# Patient Record
Sex: Male | Born: 1990 | Race: Black or African American | Hispanic: No | Marital: Single | Smoking: Never smoker
Health system: Southern US, Community
[De-identification: ages and names within clinical notes are randomized; demographics above are authoritative.]

## PROBLEM LIST (undated history)

## (undated) DIAGNOSIS — F209 Schizophrenia, unspecified: Secondary | ICD-10-CM

## (undated) DIAGNOSIS — F319 Bipolar disorder, unspecified: Secondary | ICD-10-CM

---
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
## 2009-09-16 IMAGING — CT CT HEAD WITHOUT CONTRAST
2 series · 16 of 30 positions shown, 20 images · non-contrast
Comparison: none

REASON FOR EXAM: ?first time schizophrenic break, acting differently
COMMENTS:

[Series 2: without · axial · non-contrast · 0.43mm/px · z∈[-135,+0]mm · 13 of 33 slices shown, 17 images]
[im 3/33  brain]
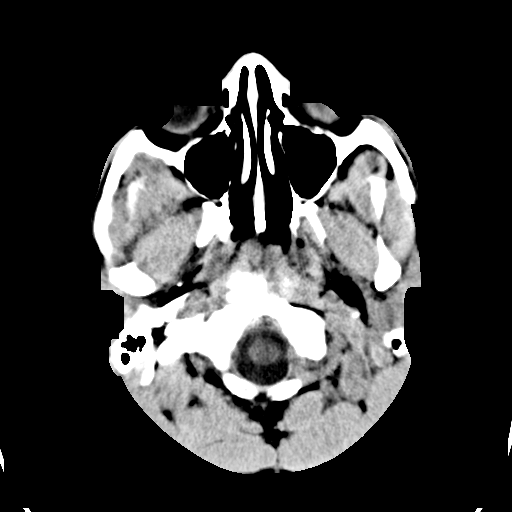
[im 3/33  bone]
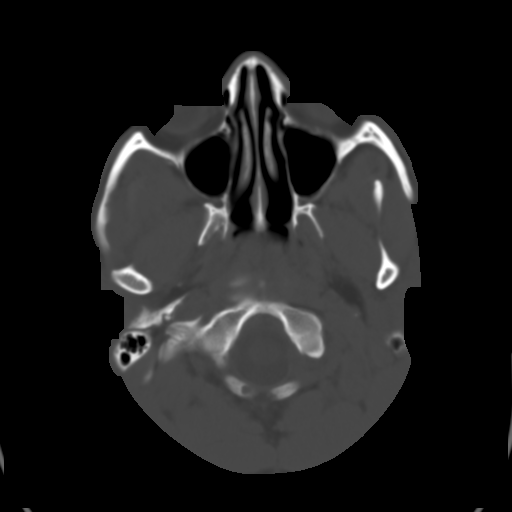
[im 5/33  brain]
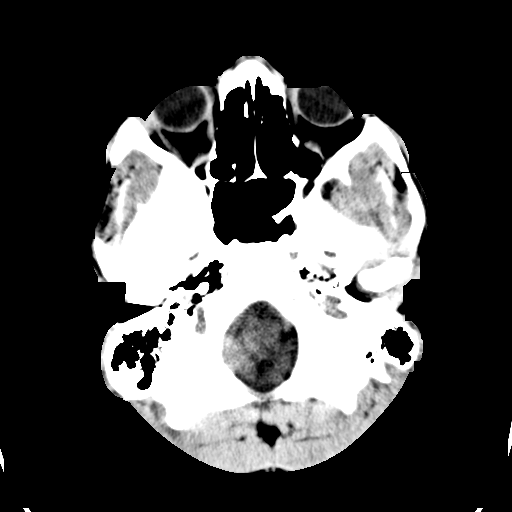
[im 7/33  brain]
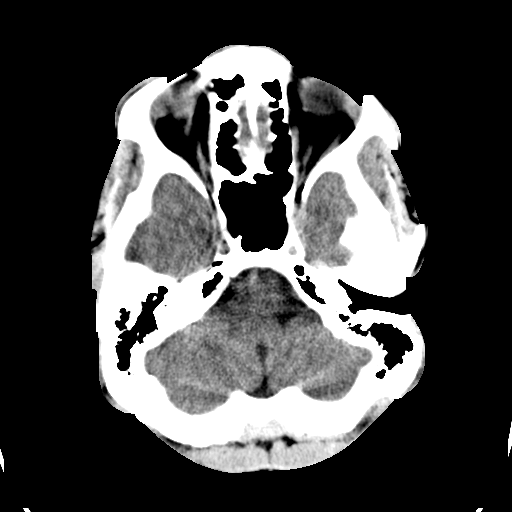
[im 10/33  brain]
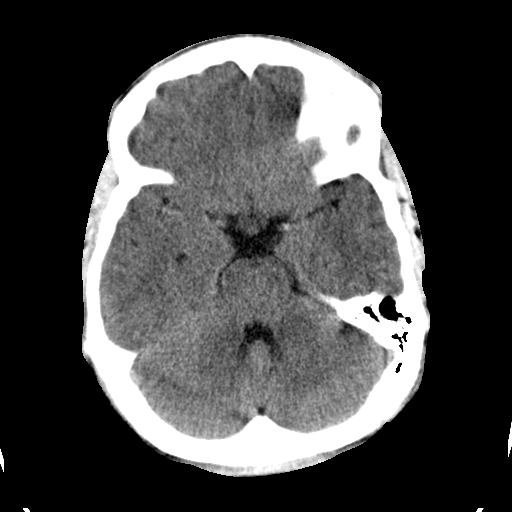
[im 12/33  brain]
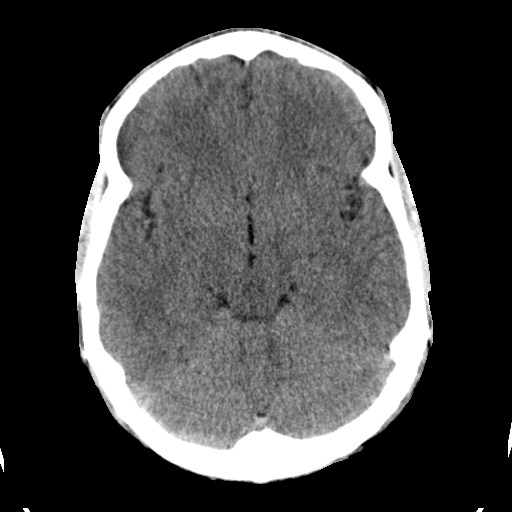
[im 12/33  bone]
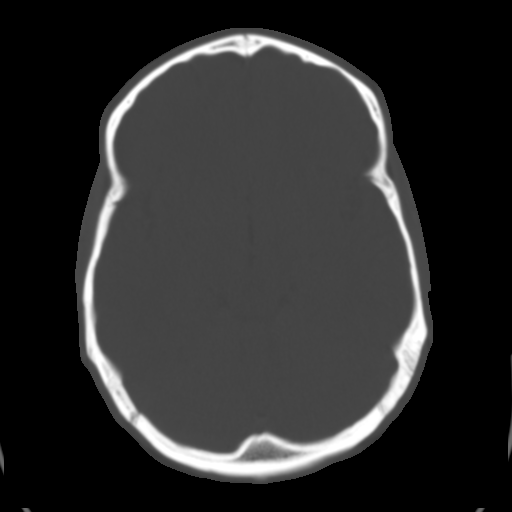
[im 14/33  brain]
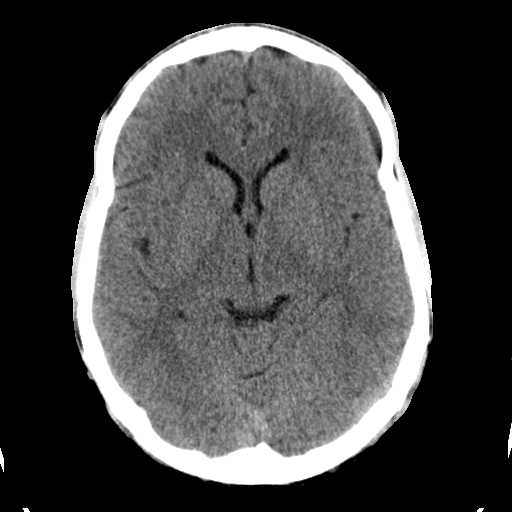
[im 17/33  brain]
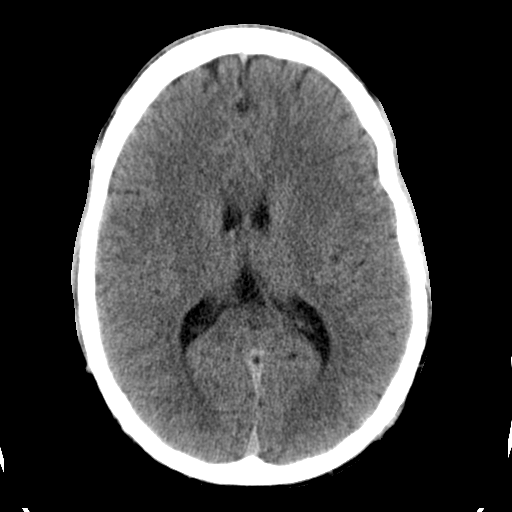
[im 19/33  brain]
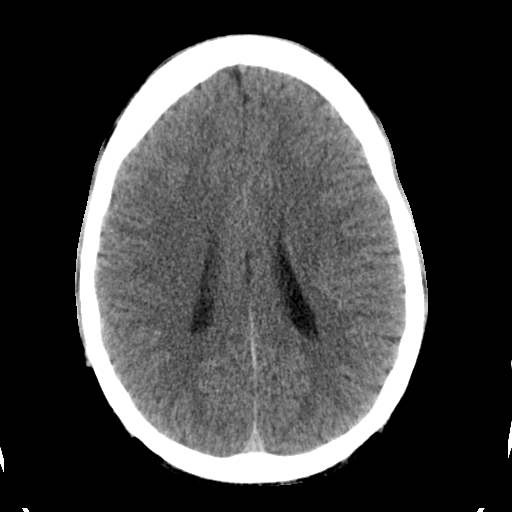
[im 21/33  brain]
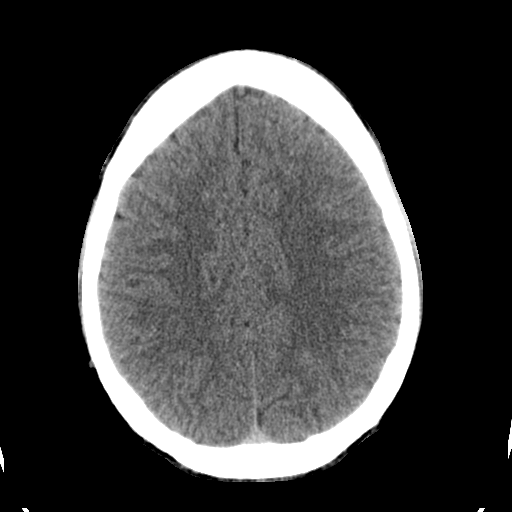
[im 21/33  bone]
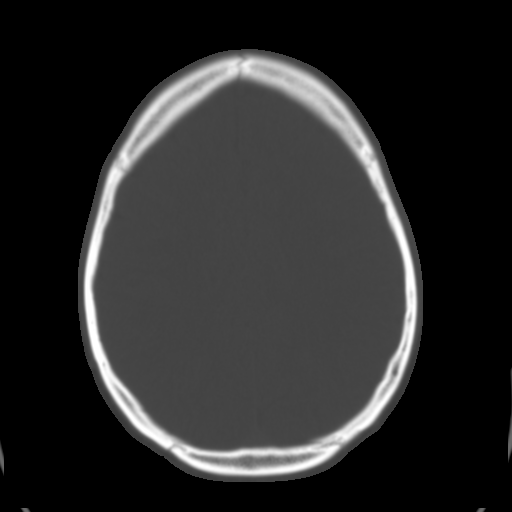
[im 23/33  brain]
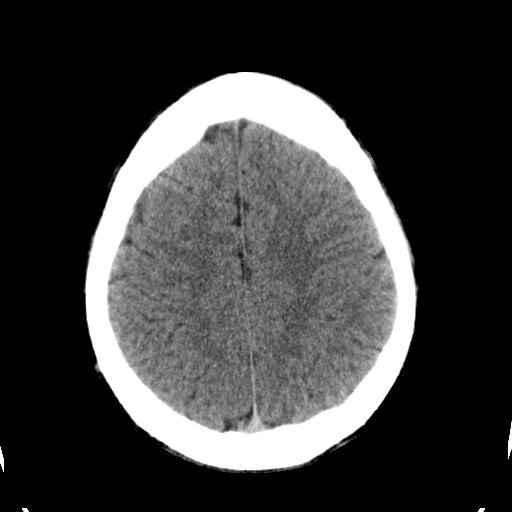
[im 26/33  brain]
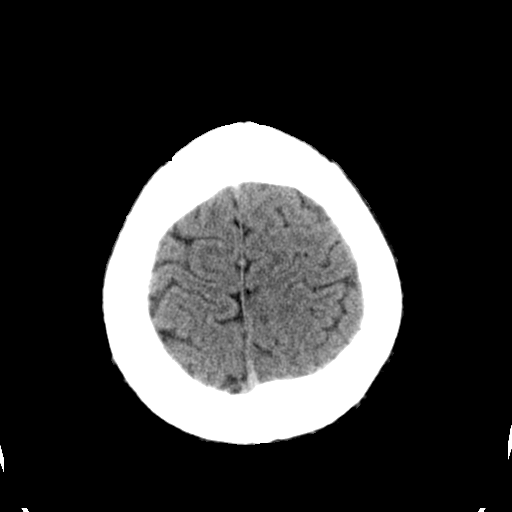
[im 28/33  brain]
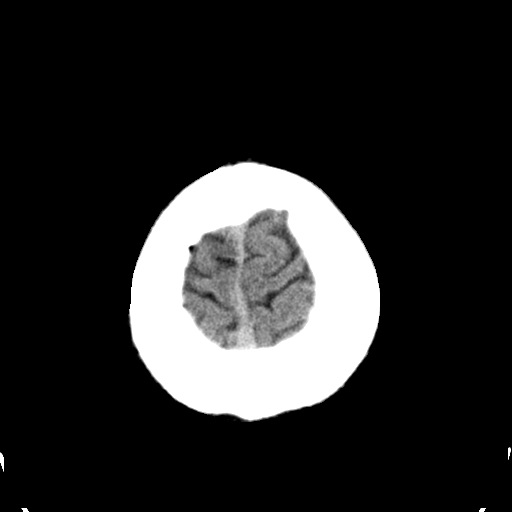
[im 30/33  brain]
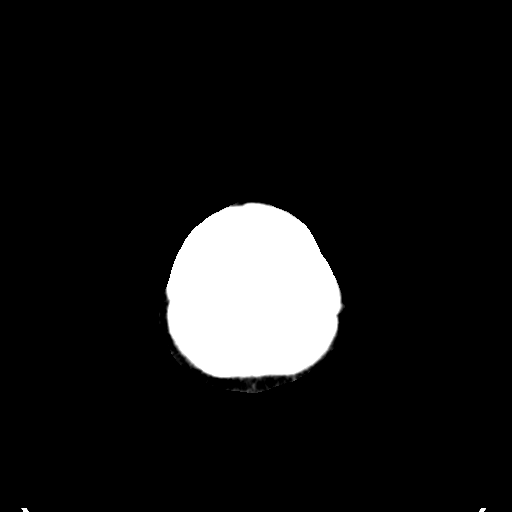
[im 30/33  bone]
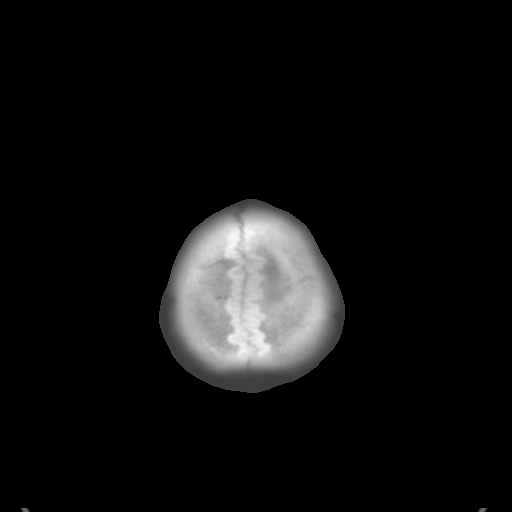

[Series 3: bone · axial · 0.43mm/px · z∈[-135,-90]mm · 3 of 32 slices shown]
[im 3/32  bone]
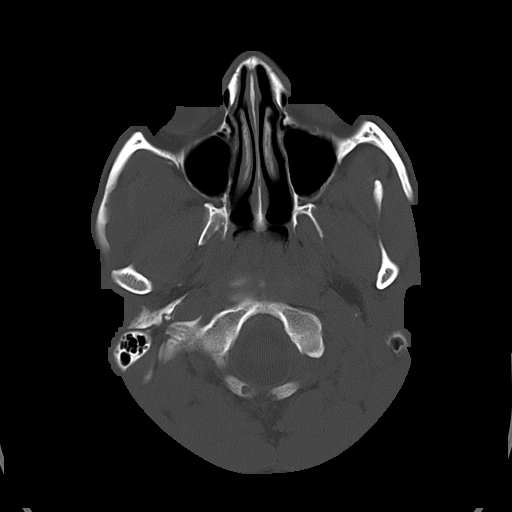
[im 7/32  bone]
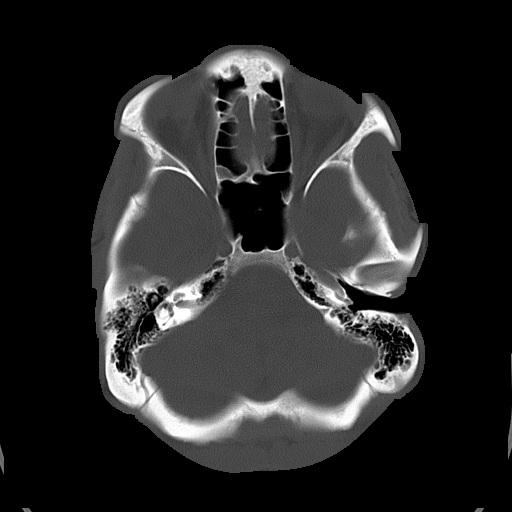
[im 12/32  bone]
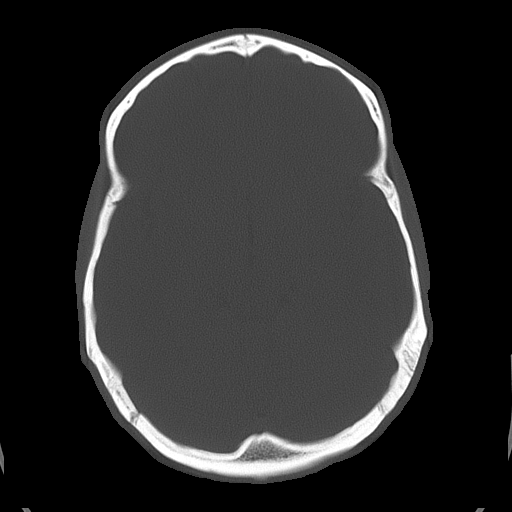

[16 of 30 positions shown; findings below may reference images not displayed]

PROCEDURE:     CT  - CT HEAD WITHOUT CONTRAST  - September 09, 2008  [DATE]

RESULT:     The patient is exhibiting mental status change. A noncontrast CT
scan was performed with images obtained at 5 millimeters intervals and slice
thicknesses.

The ventricles are normal in size and position. There is no intracranial
hemorrhage, mass, or mass-effect. The cerebellum and brainstem are normal in
density. At bone window settings the observed portions of the paranasal
sinuses exhibit no air-fluid levels. There is no lytic or blastic bony
lesion. The mastoid air cells are well pneumatized.
IMPRESSION: I see no acute intracranial abnormality.

This report was called to the [HOSPITAL] the conclusion of the
study.

## 2010-01-12 ENCOUNTER — Emergency Department: Payer: Self-pay | Admitting: Emergency Medicine

## 2010-09-04 ENCOUNTER — Emergency Department: Payer: Self-pay | Admitting: Emergency Medicine

## 2011-06-06 ENCOUNTER — Emergency Department: Payer: Self-pay | Admitting: Unknown Physician Specialty

## 2012-06-12 IMAGING — CR DG CHEST 2V
1 series · 2 of 2 positions shown · non-contrast
Comparison: none

REASON FOR EXAM: cough , fever
COMMENTS:

[Series 1: view not recorded · 0.17mm/px · 2 of 2 slices shown]
[im 1/2]
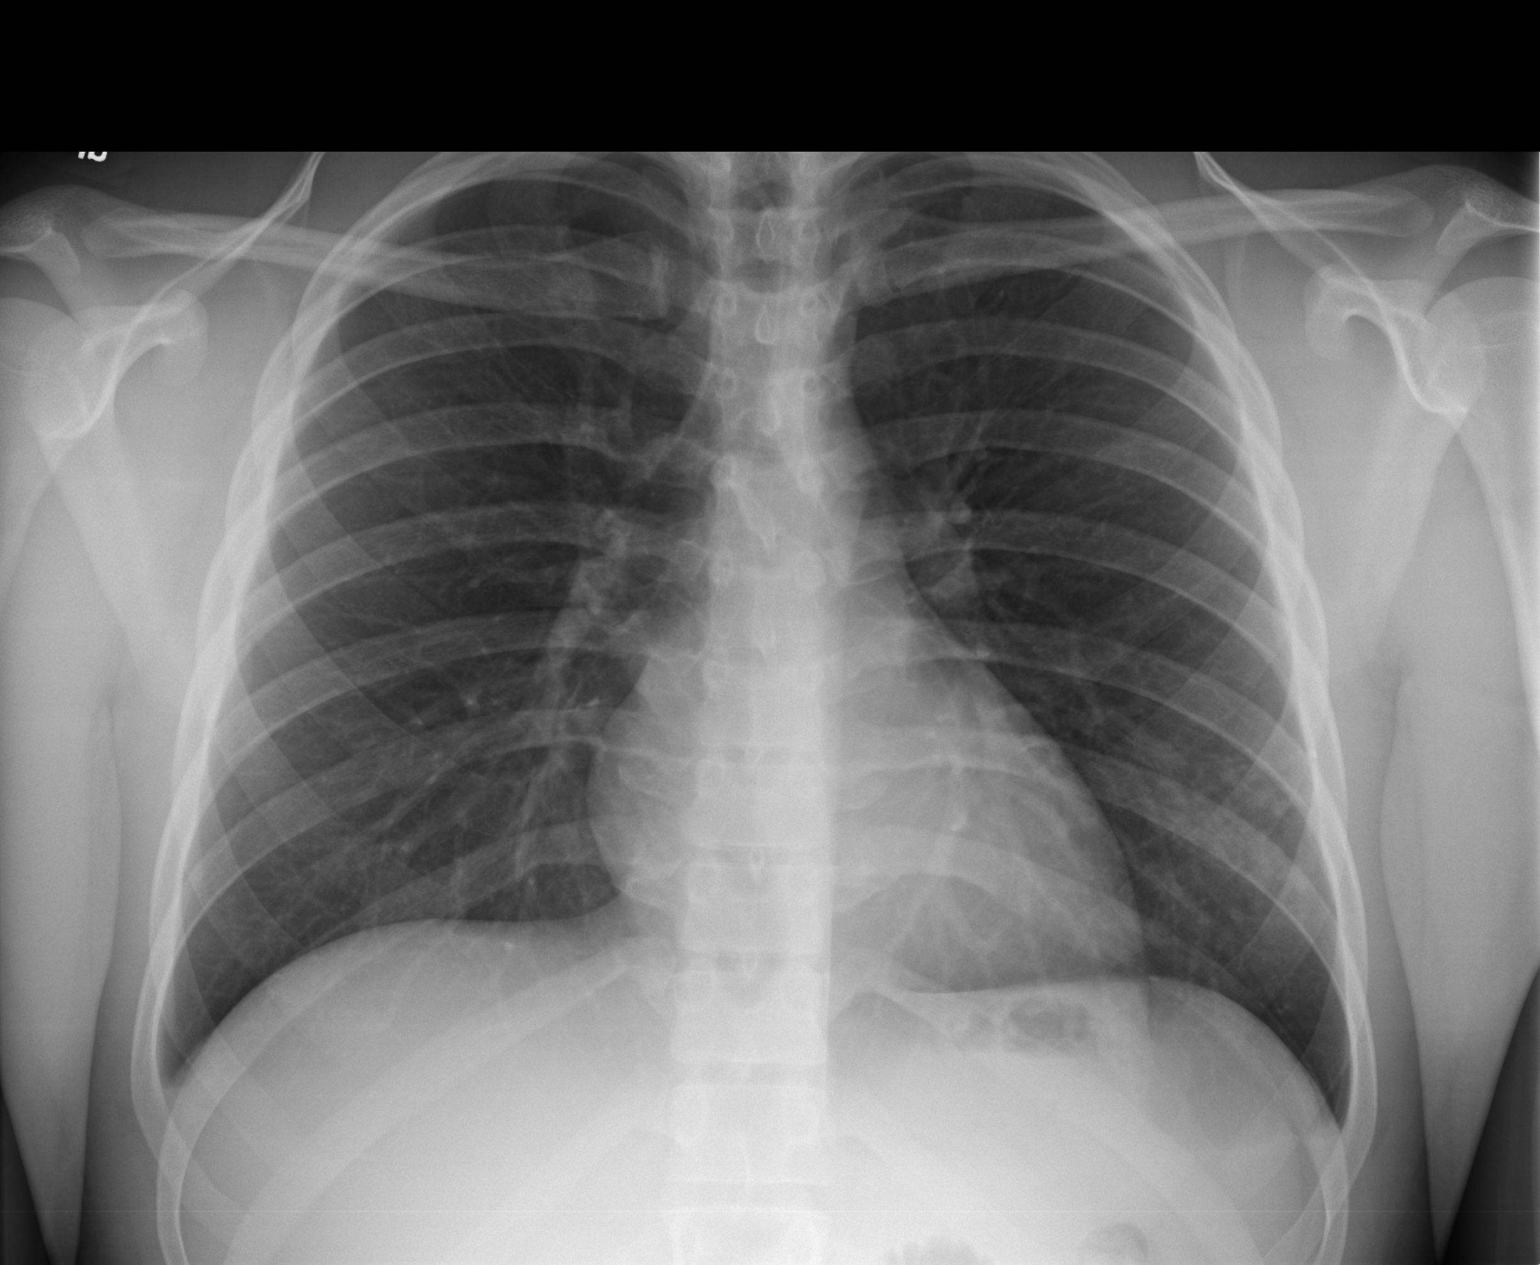
[im 2/2]
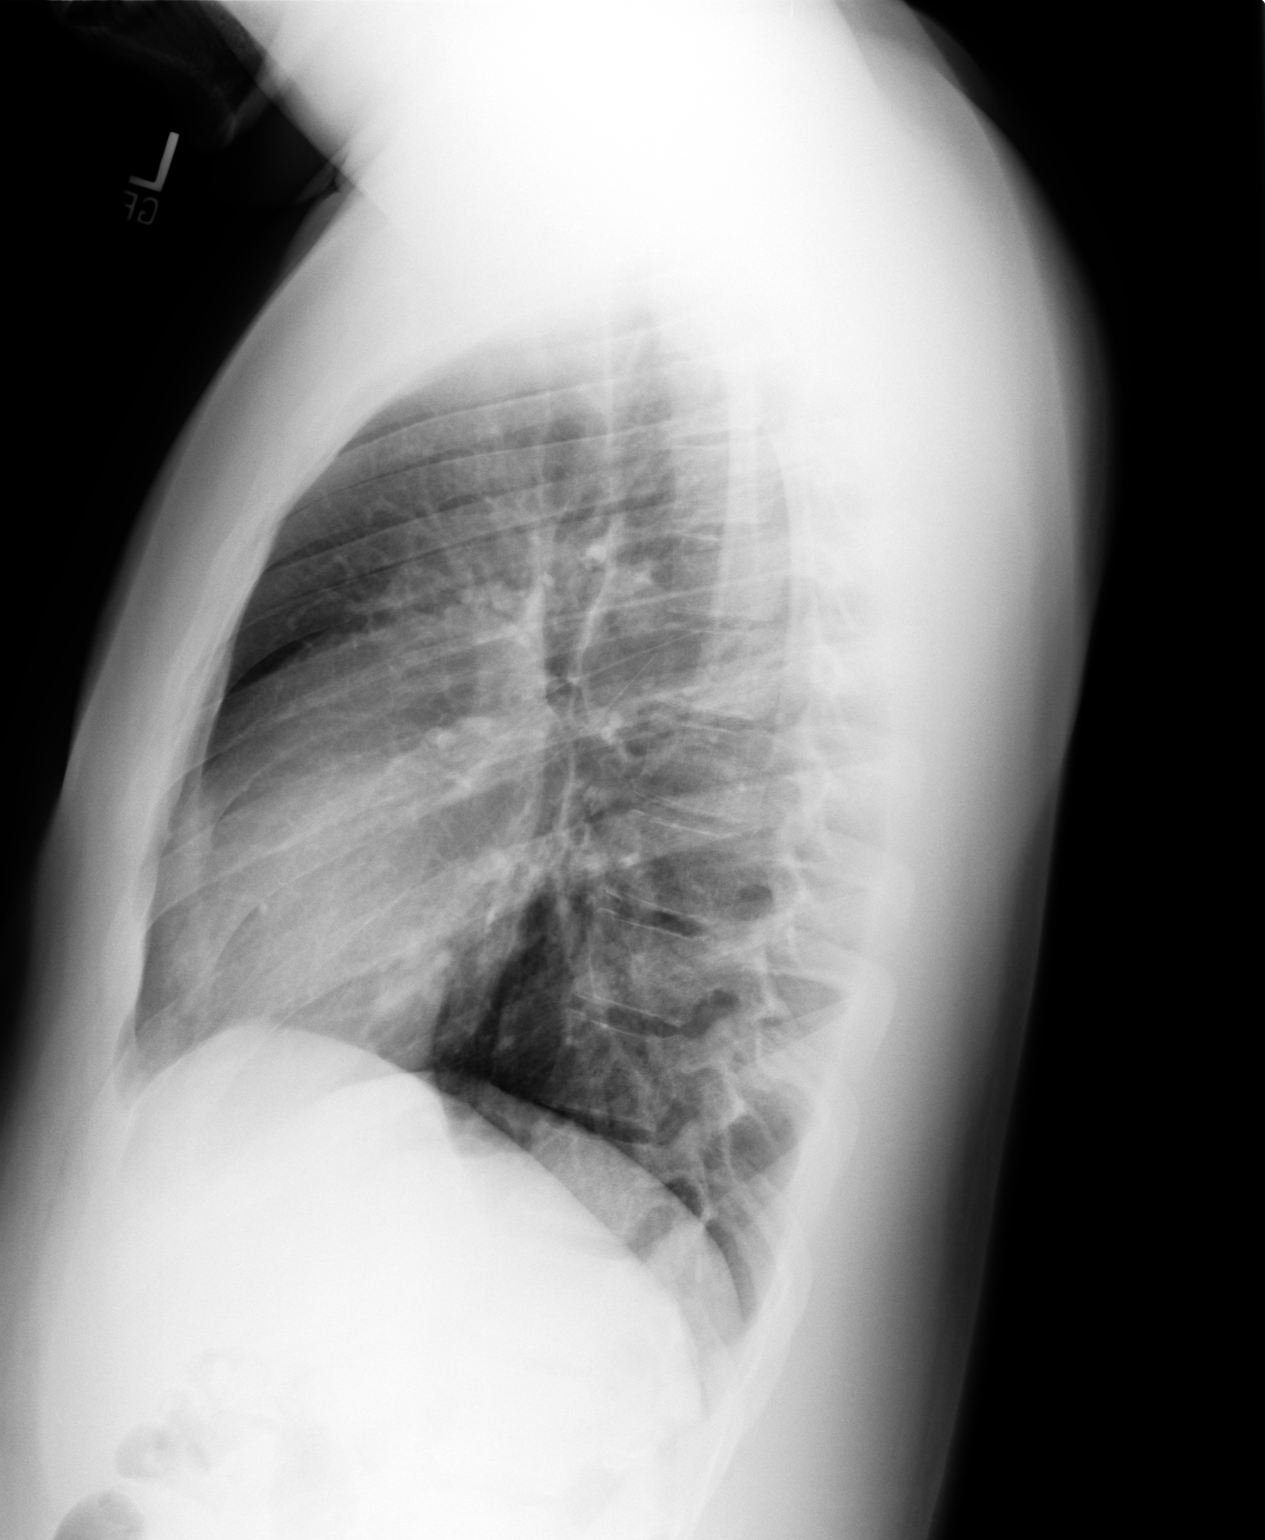

[2 of 2 positions shown; findings below may reference images not displayed]

PROCEDURE:     DXR - DXR CHEST PA (OR AP) AND LATERAL  - June 06, 2011  [DATE]

RESULT:     There is a minimal patchy increase in density in the left lower
lung field as visualized in the PA view. The finding is not definitely
identified in the lateral view. The appearance is compatible with a minimal
patchy pneumonia or with atelectasis. The right lung field is clear. Heart
size is normal. Mediastinal and osseous structures are normal in appearance.
IMPRESSION: There is a minimal patchy increase in density in the left lower lung field,
suspicious for pneumonia.

## 2012-06-12 IMAGING — CT CT ABD-PELV W/ CM
1 of 2 series · 15 of 32 positions shown, 19 images · non-contrast
Comparison: none

REASON FOR EXAM: (1) pain diffuse  iv contrast only; (2) same
COMMENTS:

[Series 2: 3mm soft tissue · axial · 0.72mm/px · z∈[-1196,-782]mm · 15 of 150 slices shown, 19 images]
[im 6/150  soft-tissue]
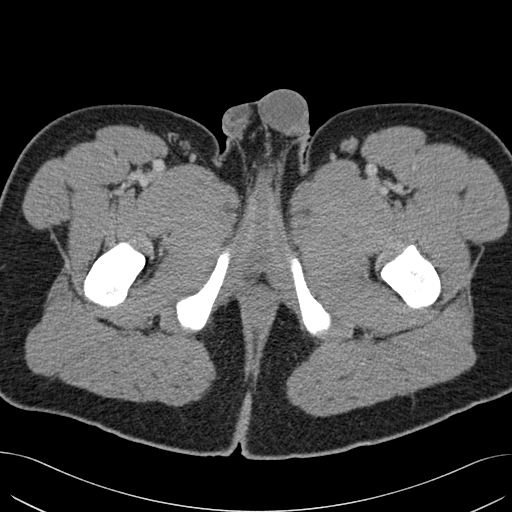
[im 6/150  bone]
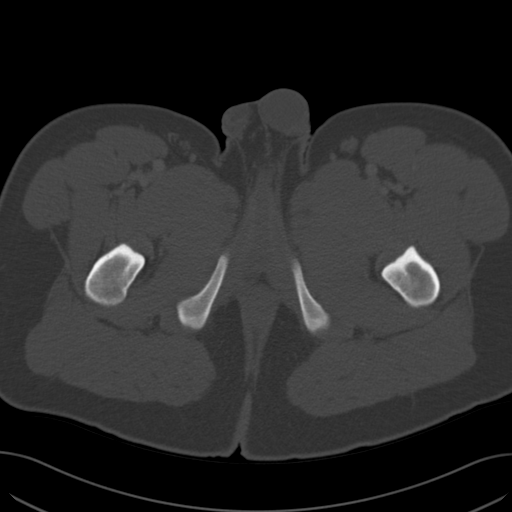
[im 18/150  soft-tissue]
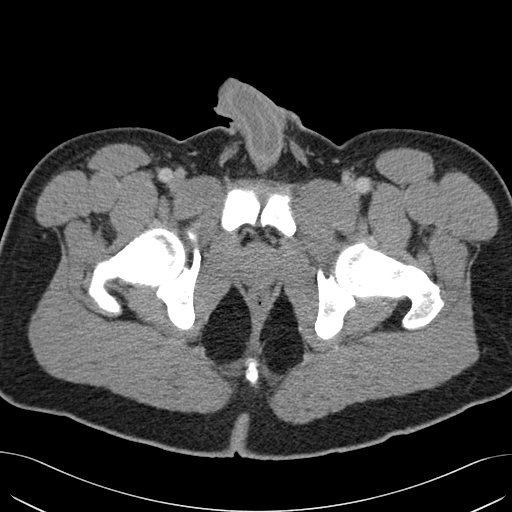
[im 30/150  soft-tissue]
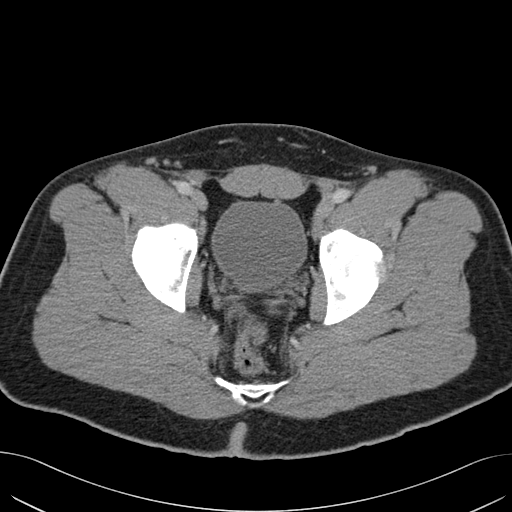
[im 42/150  soft-tissue]
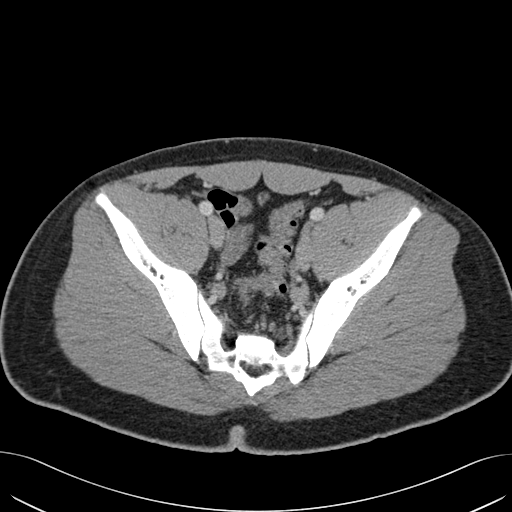
[im 54/150  soft-tissue]
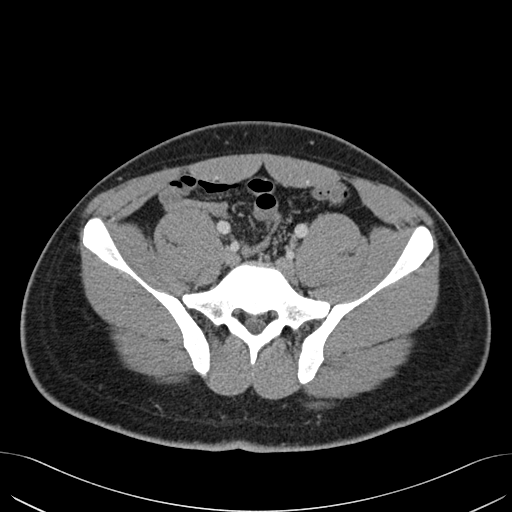
[im 66/150  soft-tissue]
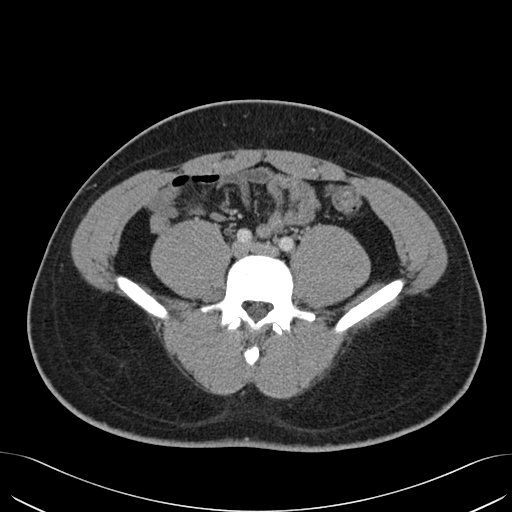
[im 78/150  soft-tissue]
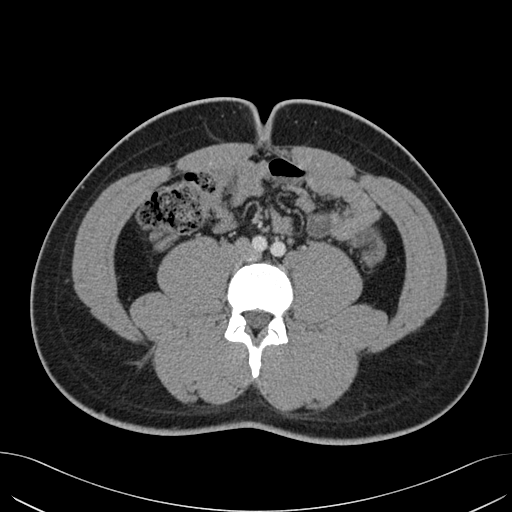
[im 84/150  soft-tissue]
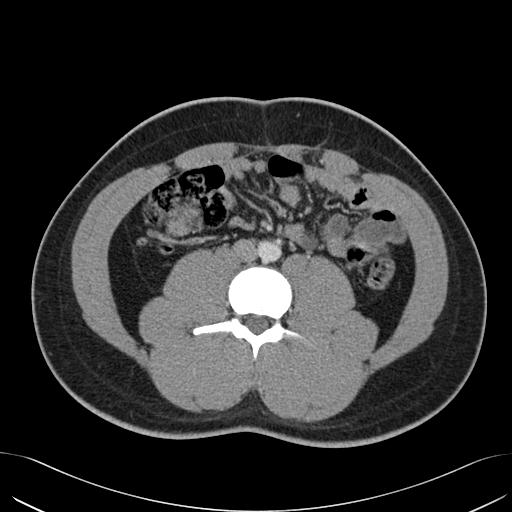
[im 96/150  soft-tissue]
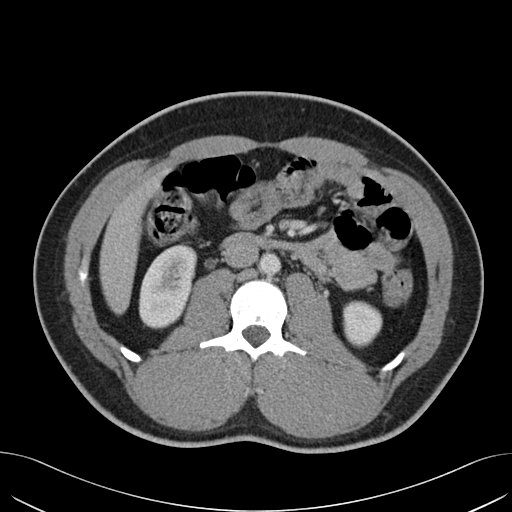
[im 96/150  bone]
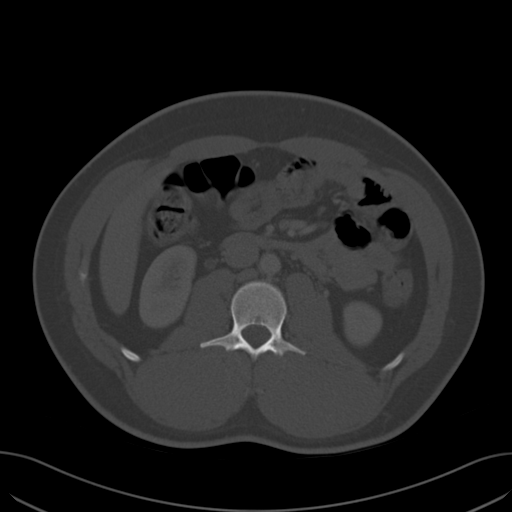
[im 108/150  soft-tissue]
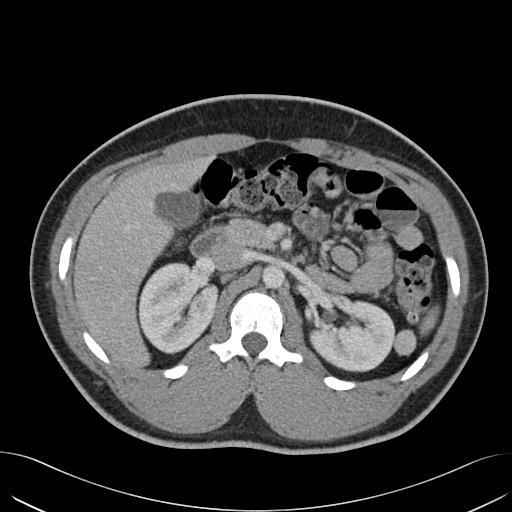
[im 120/150  soft-tissue]
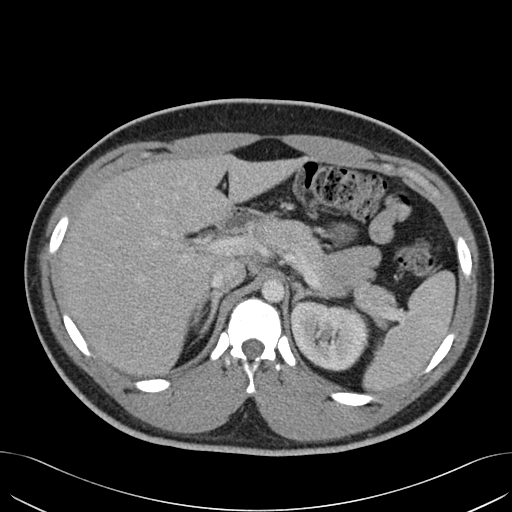
[im 126/150  lung]
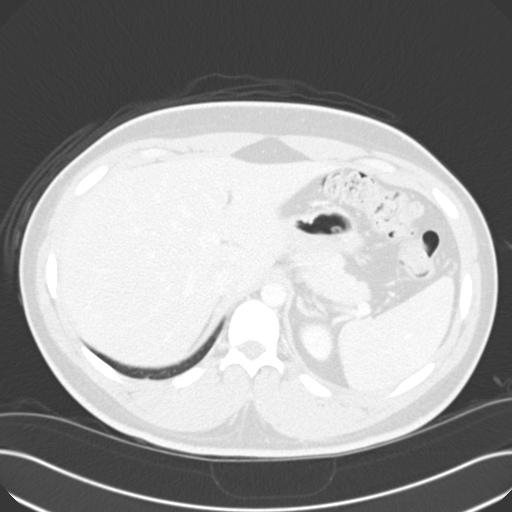
[im 132/150  soft-tissue]
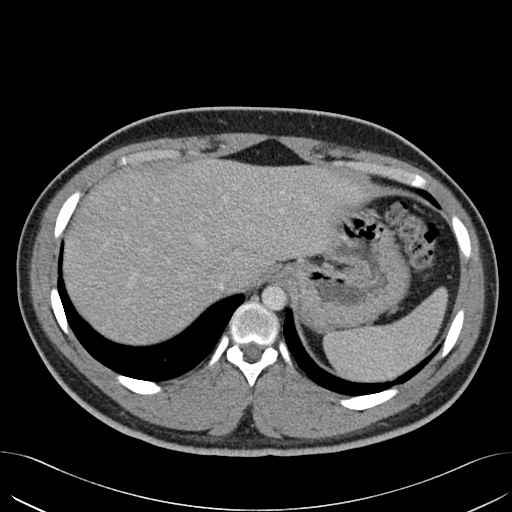
[im 132/150  lung]
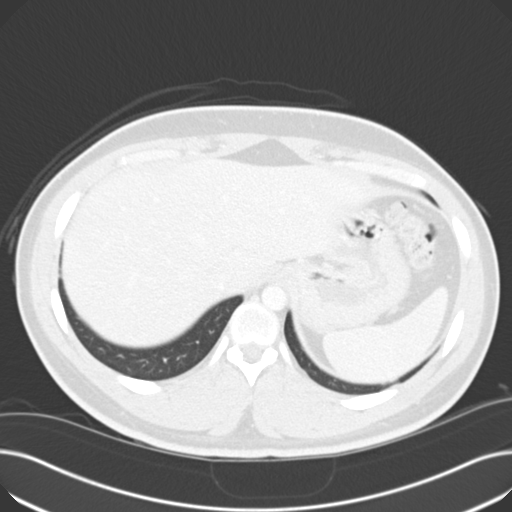
[im 138/150  lung]
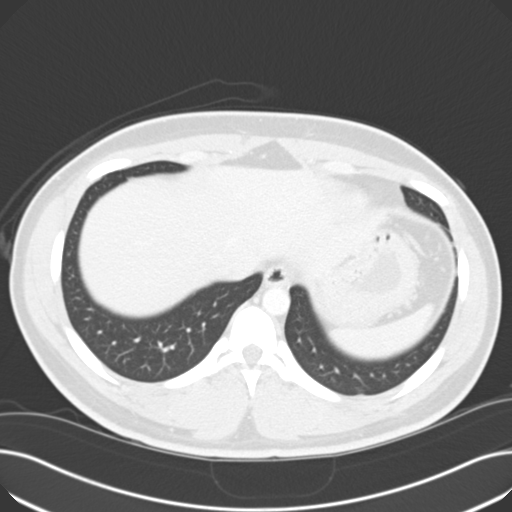
[im 144/150  soft-tissue]
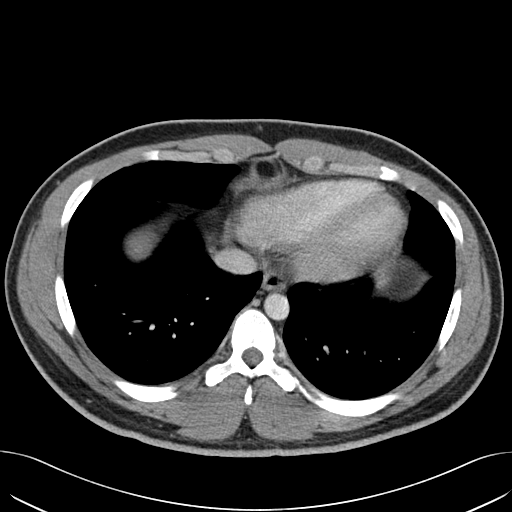
[im 144/150  lung]
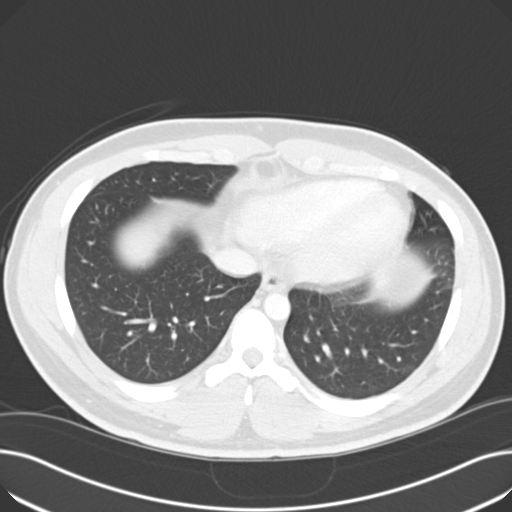

[15 of 32 positions shown; findings below may reference images not displayed]

PROCEDURE:     CT  - CT ABDOMEN / PELVIS  W  - June 06, 2011 [DATE]

RESULT:     Axial CT scanning was performed through the abdomen and pelvis
following administration of 85 cc of Psovue-TEH. The patient did not receive
oral contrast. Reconstructions were obtained at 3 mm intervals and slice
thicknesses. Review of multiplanar reconstructed images was performed
separately on the VIA monitor.

There is an abnormal appearance of the anterior aspect of the left lower
lobe demonstrated on the entry slices of the study. This could reflect
pneumonia or atelectasis in the appropriate clinical setting. The cardiac
chambers appear normal in size and I see no pleural effusion.

The liver, spleen, partially distended stomach, pancreas, gallbladder,
adrenal glands, and kidneys are normal in appearance. I see no periaortic
nor pericaval lymphadenopathy. There is an approximately 2 cm diameter
accessory spleen. The unopacified loops of small and large bowel exhibit no
evidence of ileus nor obstruction. A normal-appearing air-filled appendix is
demonstrated on images 60 through 75. Within the pelvis I see no free fluid.
The urinary bladder is partially distended and grossly normal. The prostate
gland and seminal vesicles also are grossly normal for age. I see no
inguinal hernia nor umbilical hernia. The lumbar vertebral bodies are
preserved in height. The bony pelvis exhibits no acute abnormality.
IMPRESSION: 1. I do not see evidence of bowel obstruction nor ileus or gastroenteritis.
A normal-appearing appendix is demonstrated.
2. I see no acute hepatobiliary abnormality nor acute urinary tract
abnormality.
3. I see no intra-abdominal nor pelvic lymphadenopathy.
4. There is increased density in the left lower lobe on the uppermost images
of the scan consistent with pneumonia or atelectasis. This will merit
followup.

## 2012-09-10 ENCOUNTER — Emergency Department: Payer: Self-pay | Admitting: Emergency Medicine

## 2013-03-11 ENCOUNTER — Emergency Department: Payer: Self-pay | Admitting: Emergency Medicine

## 2013-03-11 LAB — CK TOTAL AND CKMB (NOT AT ARMC)
CK, Total: 307 U/L — ABNORMAL HIGH (ref 35–232)
CK-MB: 1.6 ng/mL (ref 0.5–3.6)

## 2013-03-11 LAB — TROPONIN I: Troponin-I: <0.02 ng/mL

## 2013-03-11 LAB — COMPREHENSIVE METABOLIC PANEL
Alkaline Phosphatase: 67 U/L (ref 50–136)
Anion Gap: 8 (ref 7–16)
Bilirubin,Total: 0.5 mg/dL (ref 0.2–1.0)
Chloride: 104 mmol/L (ref 98–107)
Co2: 26 mmol/L (ref 21–32)
Creatinine: 1.23 mg/dL (ref 0.60–1.30)
EGFR (Non-African Amer.): >60
Glucose: 89 mg/dL (ref 65–99)
SGOT(AST): 35 U/L (ref 15–37)

## 2013-03-11 LAB — CBC
HCT: 41.6 % (ref 40.0–52.0)
HGB: 14.2 g/dL (ref 13.0–18.0)
MCH: 26.3 pg (ref 26.0–34.0)
MCHC: 34.3 g/dL (ref 32.0–36.0)
MCV: 77 fL — ABNORMAL LOW (ref 80–100)
RBC: 5.42 10*6/uL (ref 4.40–5.90)
RDW: 13.6 % (ref 11.5–14.5)

## 2013-06-23 ENCOUNTER — Emergency Department: Payer: Self-pay | Admitting: Emergency Medicine

## 2013-06-23 LAB — COMPREHENSIVE METABOLIC PANEL
Albumin: 4 g/dL (ref 3.4–5.0)
Alkaline Phosphatase: 63 U/L (ref 50–136)
BUN: 7 mg/dL (ref 7–18)
Bilirubin,Total: 0.7 mg/dL (ref 0.2–1.0)
Chloride: 103 mmol/L (ref 98–107)
Co2: 27 mmol/L (ref 21–32)
Creatinine: 1.34 mg/dL — ABNORMAL HIGH (ref 0.60–1.30)
Glucose: 96 mg/dL (ref 65–99)
Potassium: 3.5 mmol/L (ref 3.5–5.1)
Sodium: 139 mmol/L (ref 136–145)
Total Protein: 7.7 g/dL (ref 6.4–8.2)

## 2013-06-23 LAB — CK TOTAL AND CKMB (NOT AT ARMC): CK-MB: 1.9 ng/mL (ref 0.5–3.6)

## 2013-06-23 LAB — CBC
HCT: 44.1 % (ref 40.0–52.0)
HGB: 15.3 g/dL (ref 13.0–18.0)
MCHC: 34.8 g/dL (ref 32.0–36.0)
MCV: 78 fL — ABNORMAL LOW (ref 80–100)
RBC: 5.69 10*6/uL (ref 4.40–5.90)
WBC: 8.1 10*3/uL (ref 3.8–10.6)

## 2013-06-23 LAB — LIPASE, BLOOD: Lipase: 98 U/L (ref 73–393)

## 2013-06-23 LAB — TROPONIN I: Troponin-I: <0.02 ng/mL

## 2013-06-23 LAB — DRUG SCREEN, URINE
Benzodiazepine, Ur Scrn: NEGATIVE (ref ?–200)
Cannabinoid 50 Ng, Ur ~~LOC~~: NEGATIVE (ref ?–50)

## 2013-06-23 LAB — TSH: Thyroid Stimulating Horm: 2.63 u[IU]/mL

## 2013-11-23 ENCOUNTER — Emergency Department: Payer: Self-pay | Admitting: Emergency Medicine

## 2014-03-18 IMAGING — CR DG CHEST 2V
1 series · 2 of 2 positions shown · non-contrast
Comparison: none

REASON FOR EXAM: CP
COMMENTS:

[Series 1: pa · 0.17mm/px · 2 of 2 slices shown]
[im 1/2]
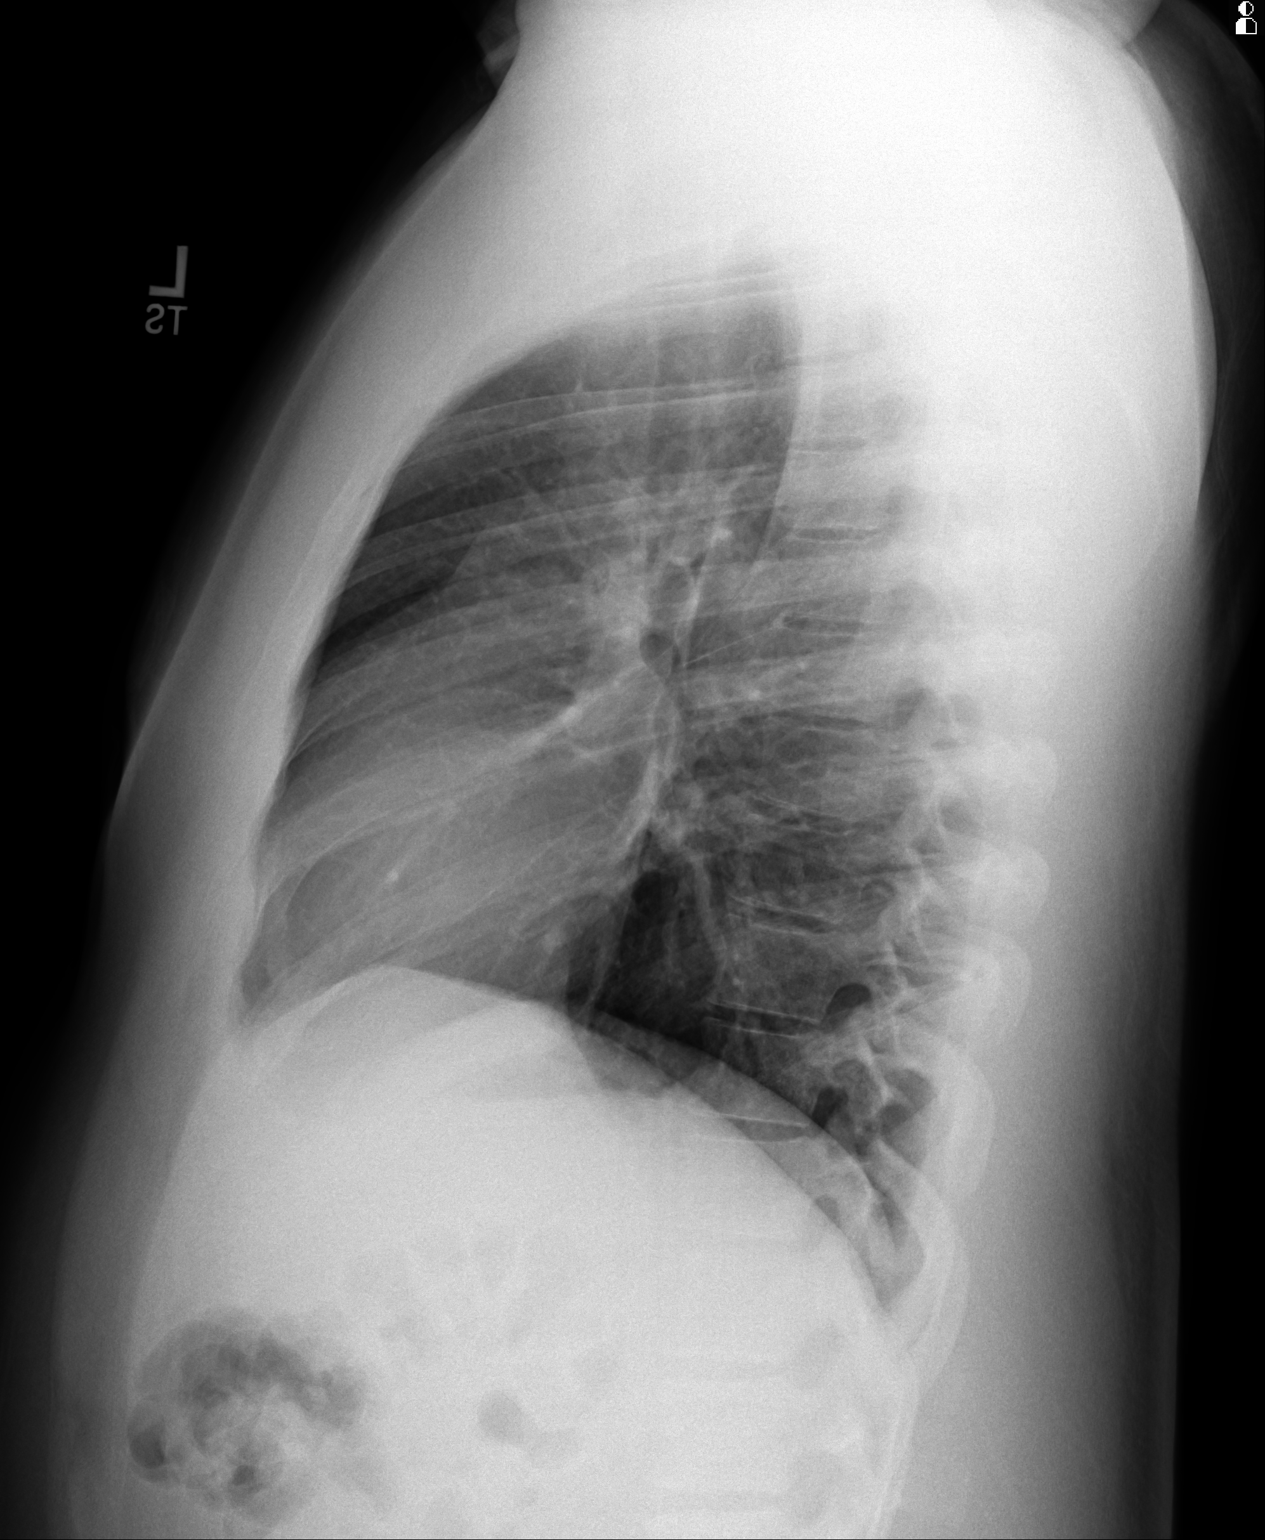
[im 2/2]
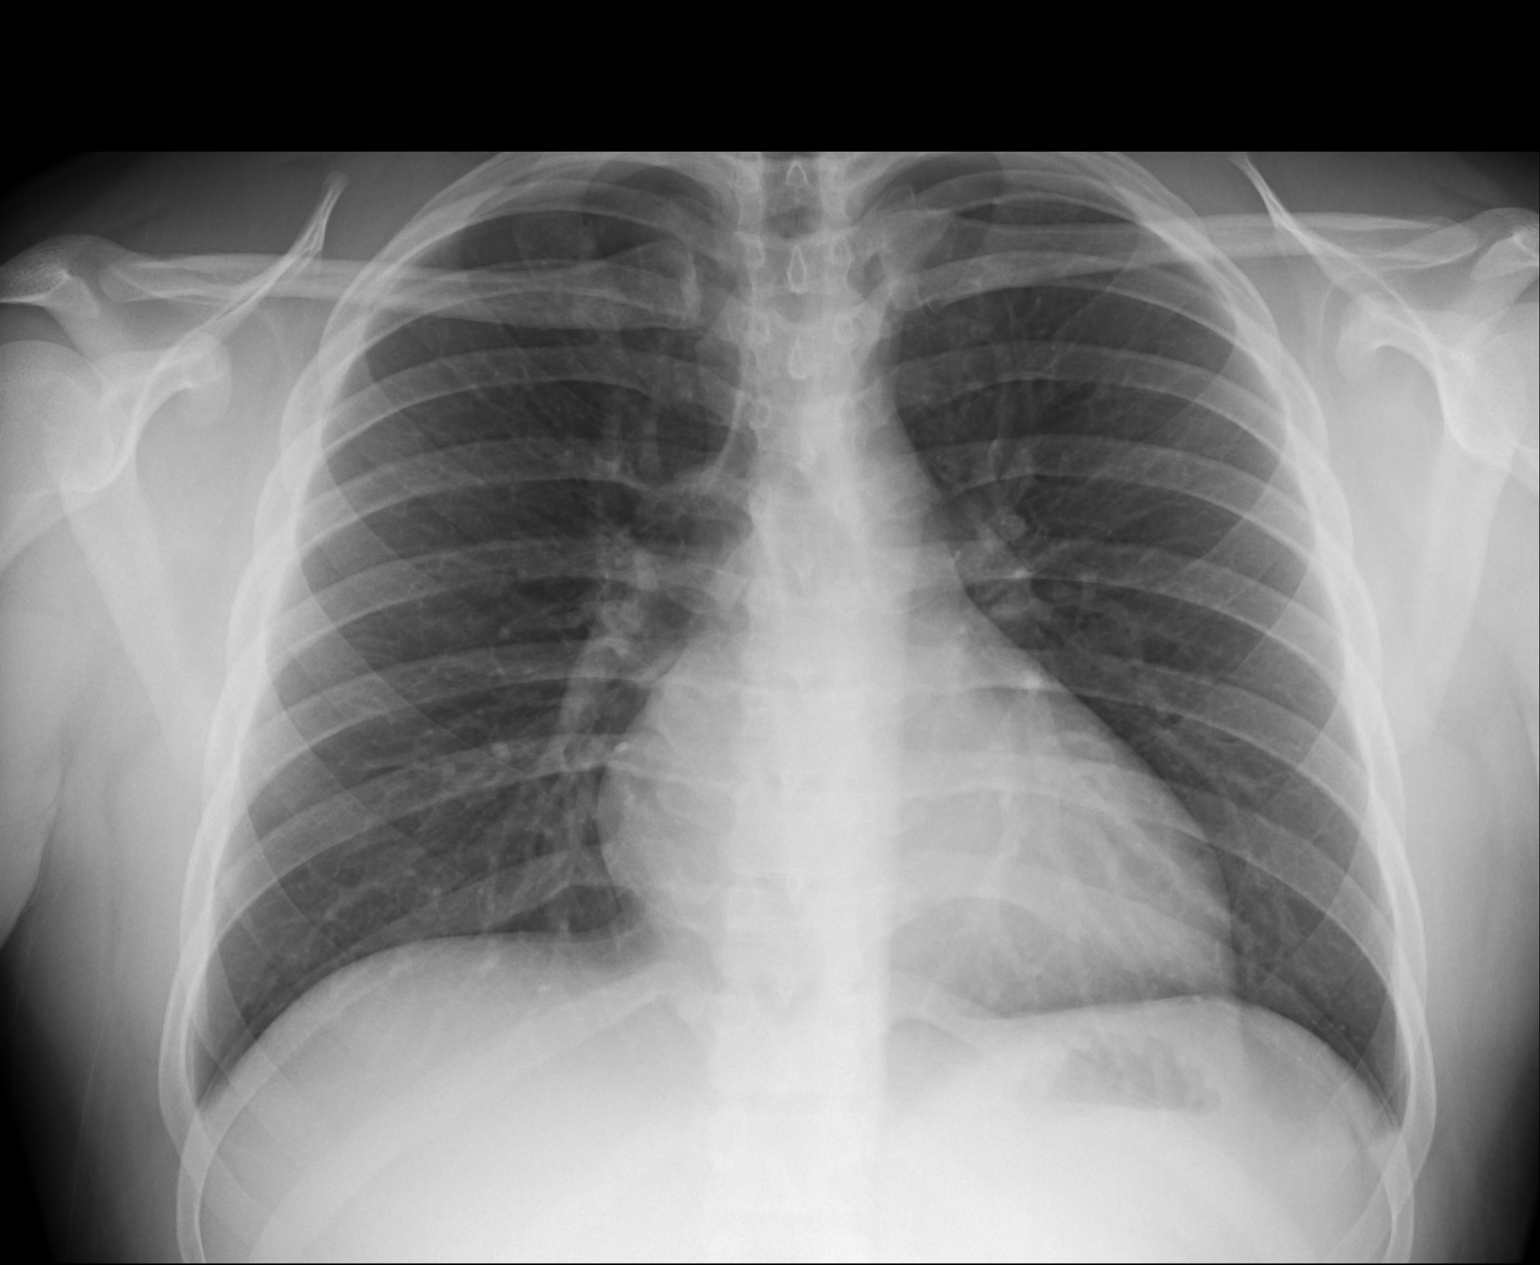

[2 of 2 positions shown; findings below may reference images not displayed]

PROCEDURE:     DXR - DXR CHEST PA (OR AP) AND LATERAL  - March 11, 2013  [DATE]

RESULT:     The lungs are adequately inflated and clear. The heart and
mediastinal structures are within the limits of normal. There is no pleural
effusion or pneumothorax. The trachea is midline. The bony thorax exhibits
no acute abnormality.
IMPRESSION: There is no evidence of acute cardiopulmonary abnormality.

[REDACTED]

## 2014-06-30 IMAGING — CR DG CHEST 2V
1 series · 2 of 2 positions shown · non-contrast
Comparison: none

REASON FOR EXAM: Chest Pain
COMMENTS:

[Series 1: w chest pa · 0.14mm/px · 2 of 2 slices shown]
[im 1/2]
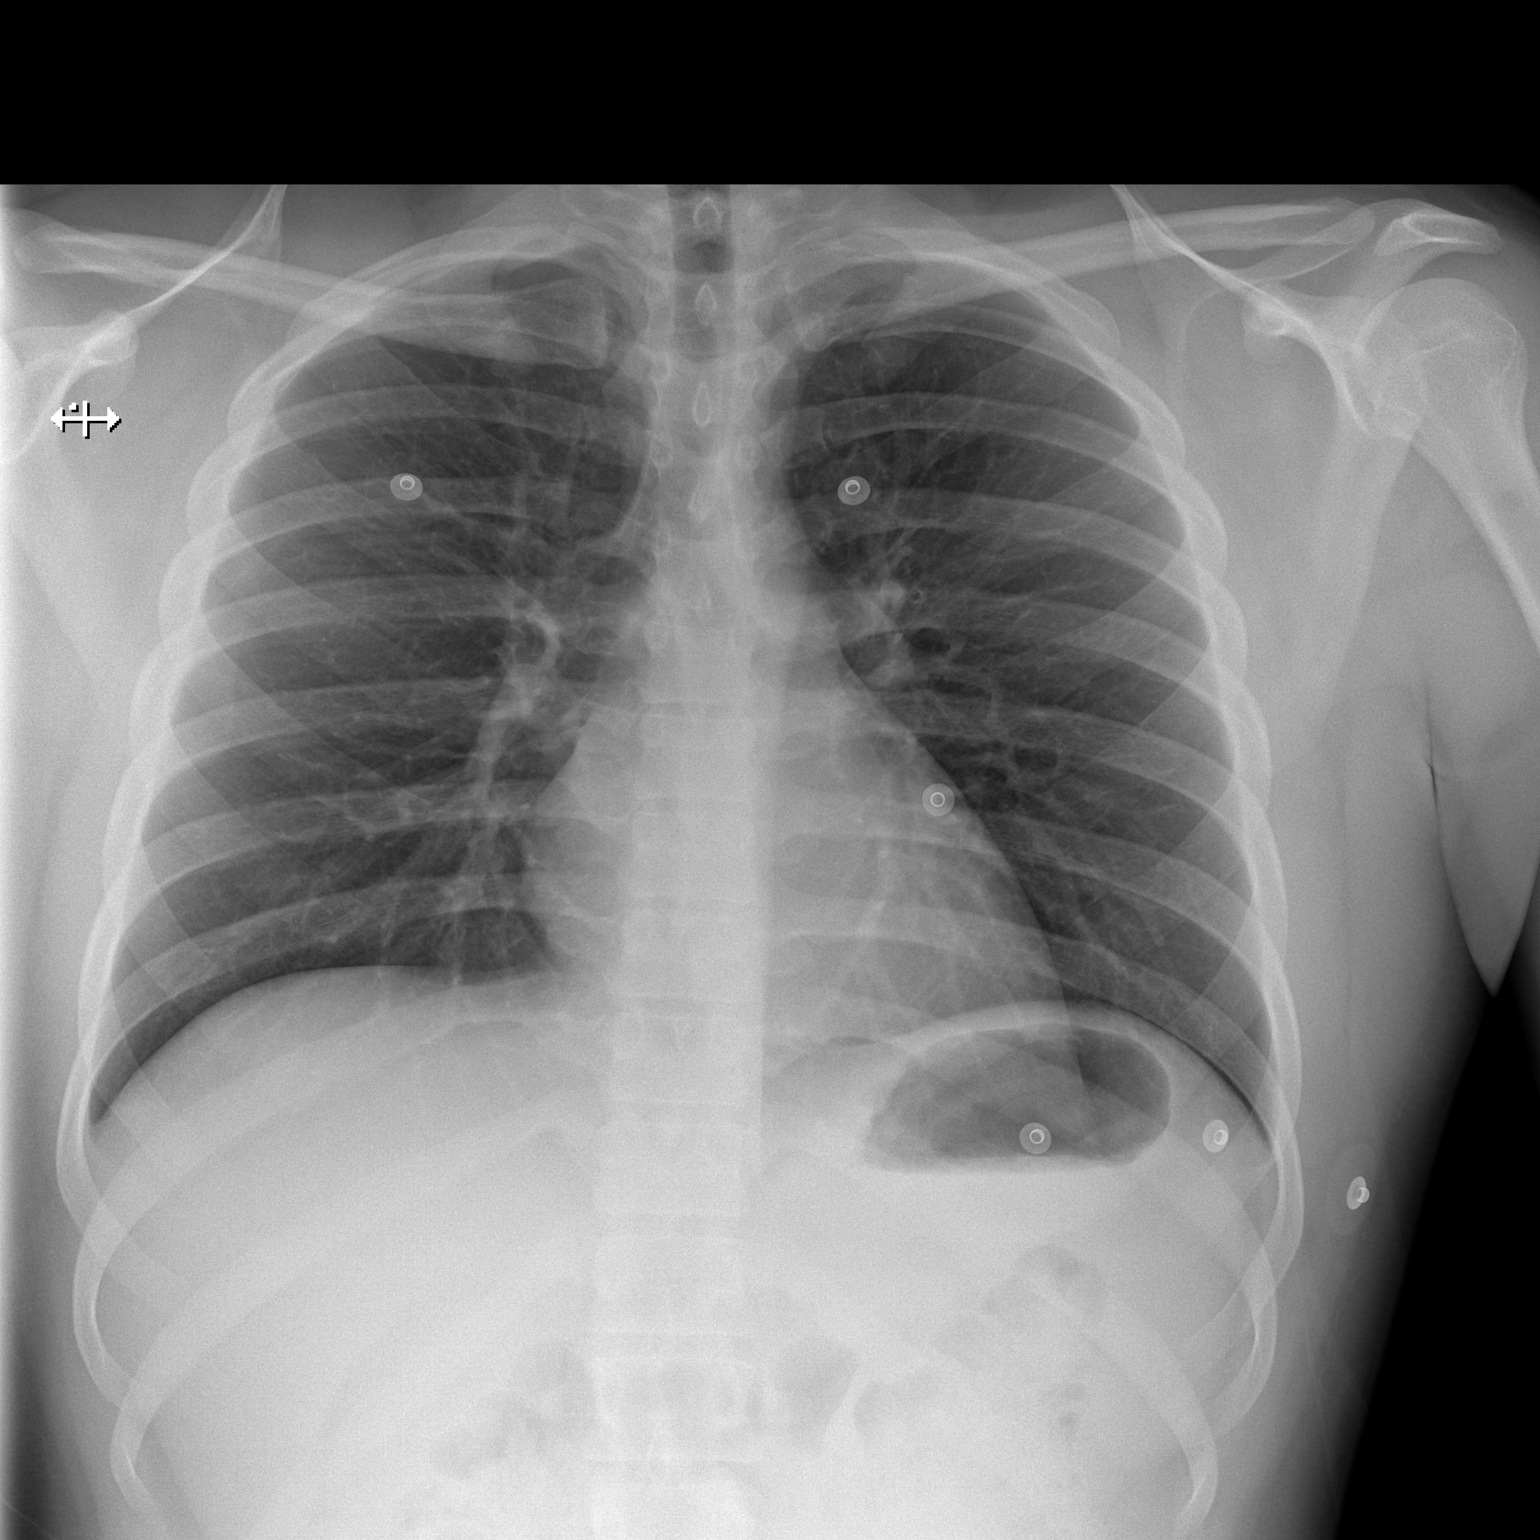
[im 2/2]
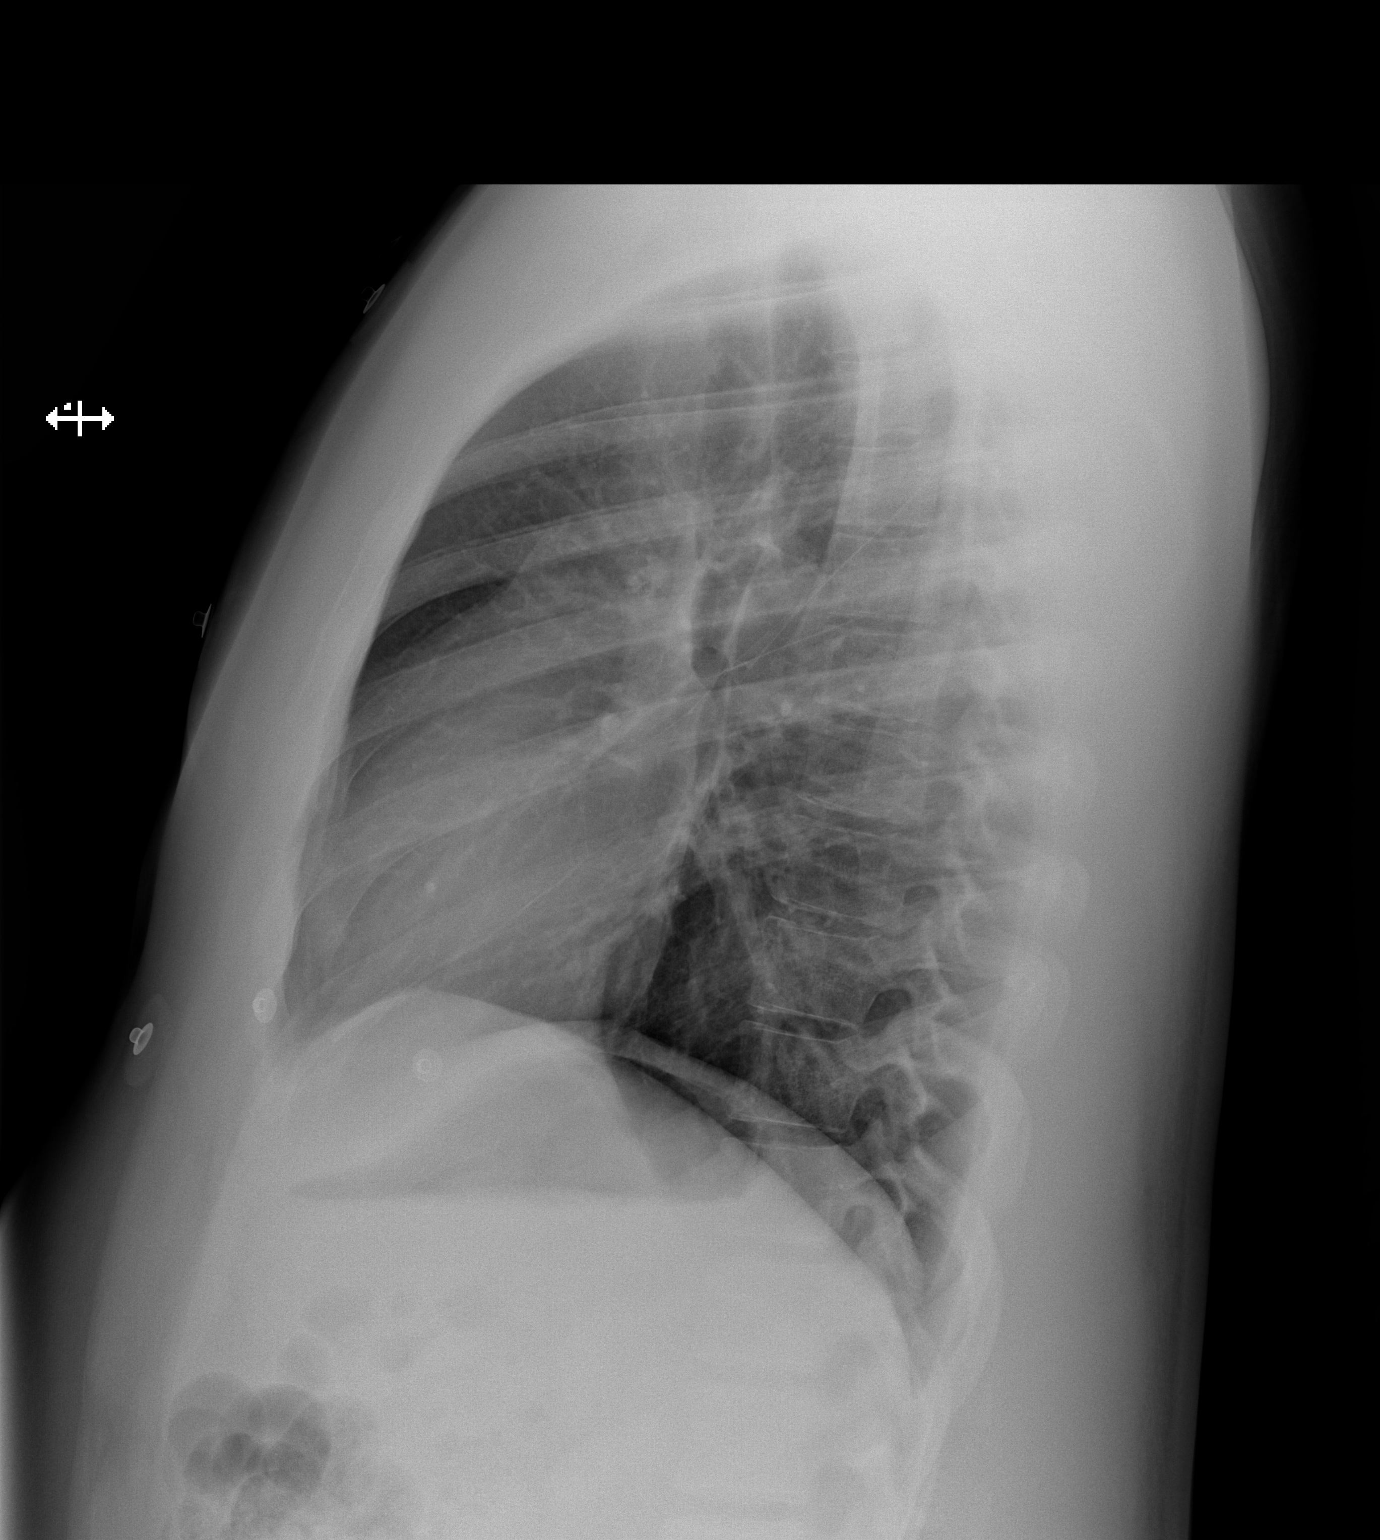

[2 of 2 positions shown; findings below may reference images not displayed]

PROCEDURE:     DXR - DXR CHEST PA (OR AP) AND LATERAL  - June 23, 2013  [DATE]

RESULT:     Comparison is made to the study March 11, 2013.

The lungs are adequately inflated. There is no focal infiltrate. There is no
pneumothorax or pneumomediastinum or pleural effusion. The perihilar
interstitial markings are slightly more conspicuous today than on the
previous study.
IMPRESSION: There are mildly increased perihilar lung markings which
suggest subsegmental atelectasis. There is no enlargement of the cardiac
silhouette nor definite pulmonary vascular congestion.

[REDACTED]

## 2014-06-30 IMAGING — CR DG ABDOMEN 1V
1 series · 2 of 2 positions shown · non-contrast
Comparison: none

REASON FOR EXAM: c/o constipation for 6 months
COMMENTS:

[Series 3: t abdomen supine · 0.14mm/px · 2 of 2 slices shown]
[im 1/2]
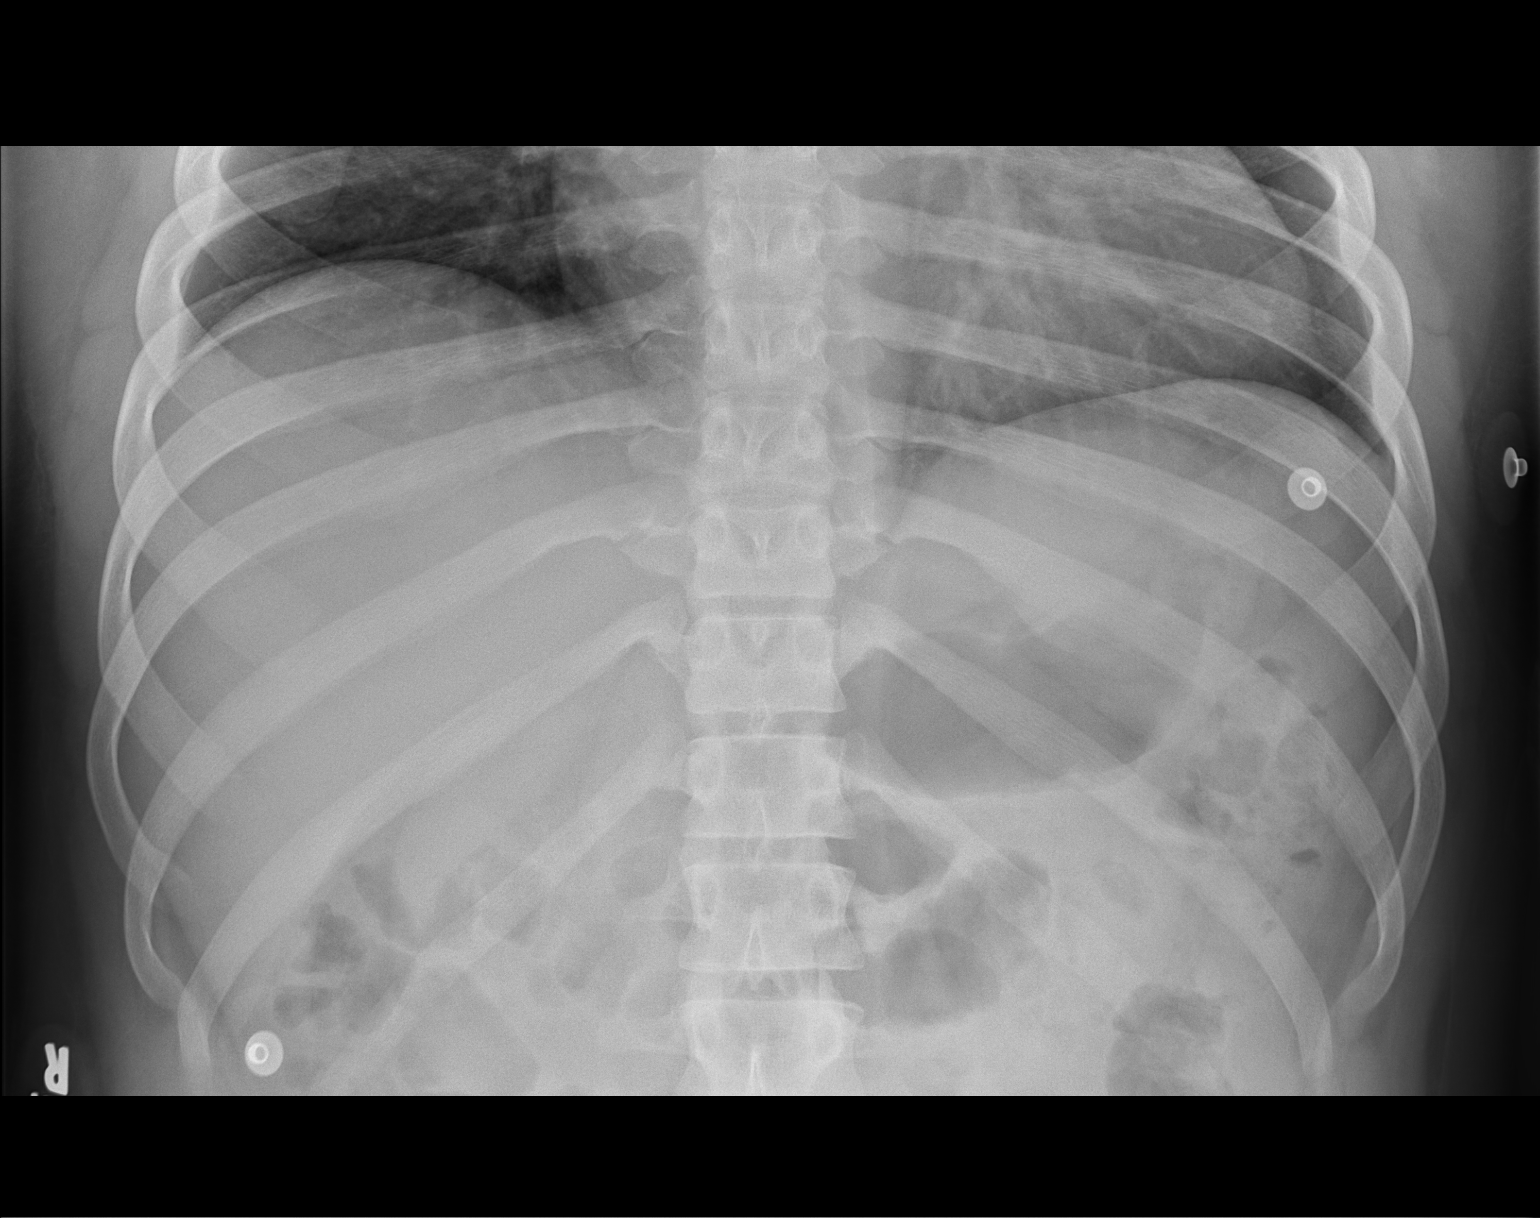
[im 2/2]
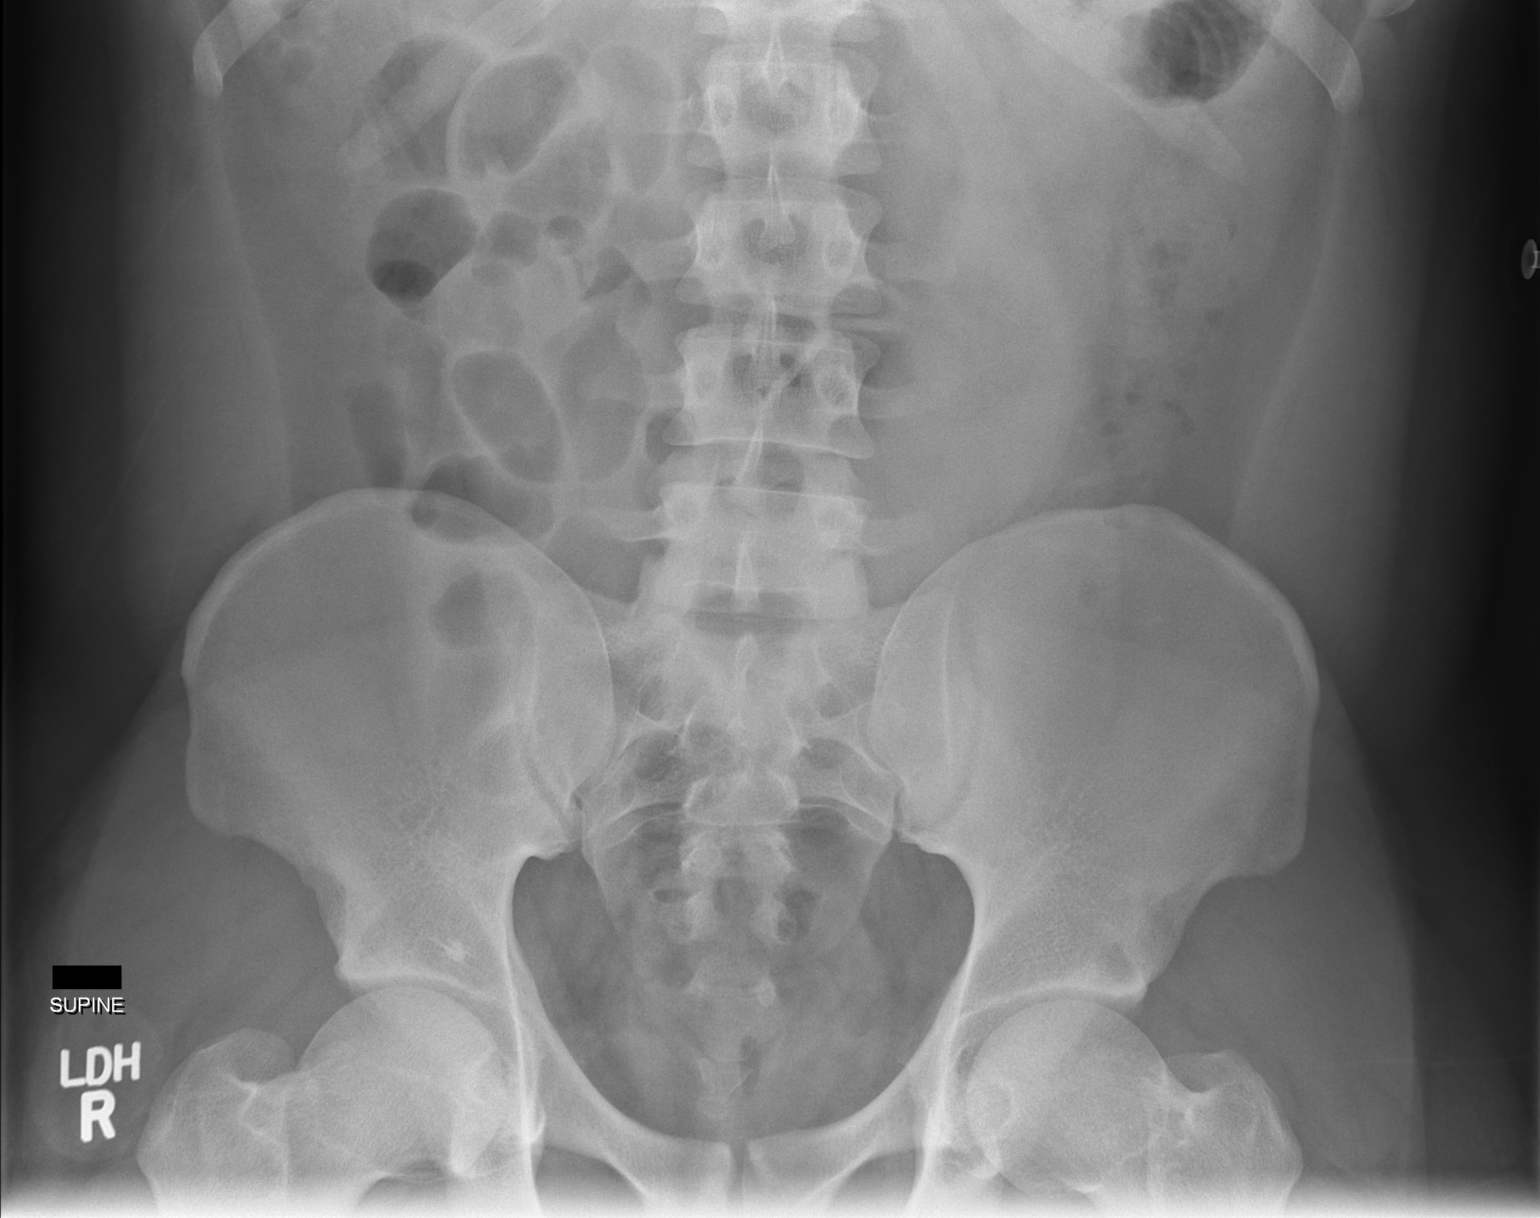

[2 of 2 positions shown; findings below may reference images not displayed]

PROCEDURE:     DXR - DXR KIDNEY URETER BLADDER  - June 23, 2013  [DATE]

RESULT:     The bowel gas pattern is within the limits of normal. A moderate
amount of stool is noted in the left colon and rectosigmoid. There are no
abnormal soft tissue calcifications. The bony structures are normal in
appearance.
IMPRESSION: The bowel gas pattern is within the limits of normal. Mild
constipation cannot be absolutely excluded.

[REDACTED]

## 2014-10-11 ENCOUNTER — Emergency Department: Payer: Self-pay | Admitting: Student

## 2014-12-01 IMAGING — CR NECK SOFT TISSUES - 1+ VIEW
1 series · 1 of 1 positions shown · non-contrast
Comparison: None available for comparison at time of study
interpretation.

CLINICAL DATA: Globus sensation, evaluate for foreign body.

EXAM:
NECK SOFT TISSUES - 1+ VIEW

[ap]
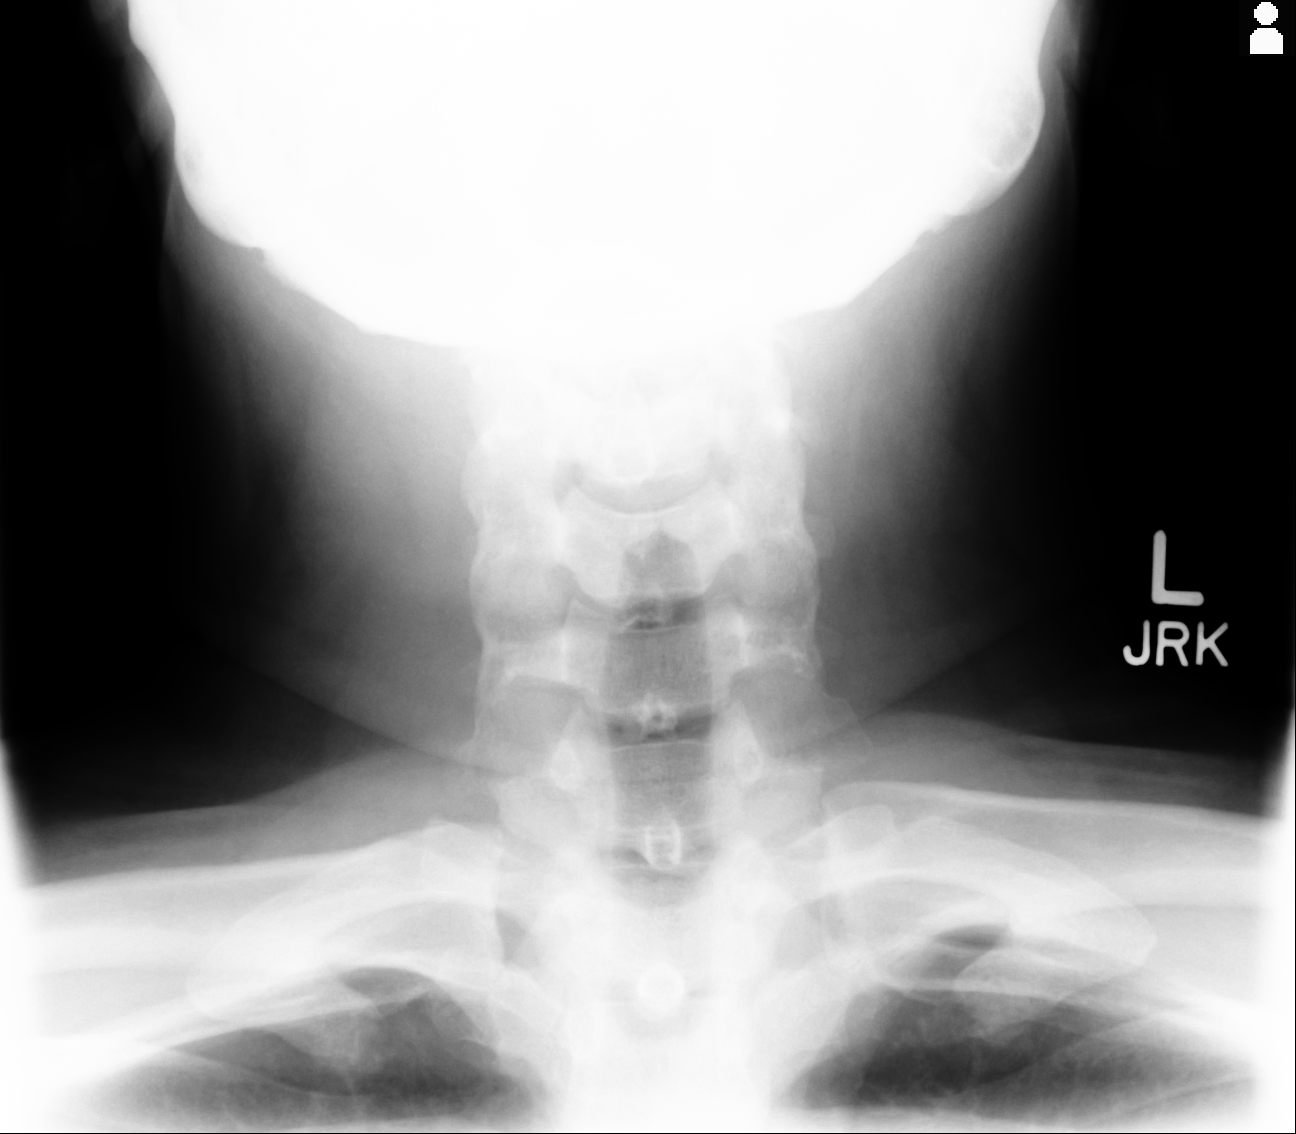

[1 of 1 positions shown; findings below may reference images not displayed]

FINDINGS: There is no evidence of retropharyngeal soft tissue swelling or
epiglottic enlargement. The cervical airway is unremarkable and no
radio-opaque foreign body identified.
IMPRESSION: Negative.

  By: Davudd Polad

## 2016-01-20 ENCOUNTER — Emergency Department
Admission: EM | Admit: 2016-01-20 | Discharge: 2016-01-20 | Disposition: A | Discharge status: Home / Self Care | Payer: No Typology Code available for payment source | Attending: Emergency Medicine | Admitting: Emergency Medicine

## 2016-01-20 ENCOUNTER — Encounter: Payer: Self-pay | Admitting: Emergency Medicine

## 2016-01-20 ENCOUNTER — Emergency Department: Payer: No Typology Code available for payment source

## 2016-01-20 DIAGNOSIS — Y9389 Activity, other specified: Secondary | ICD-10-CM | POA: Diagnosis not present

## 2016-01-20 DIAGNOSIS — Y999 Unspecified external cause status: Secondary | ICD-10-CM | POA: Diagnosis not present

## 2016-01-20 DIAGNOSIS — M25552 Pain in left hip: Secondary | ICD-10-CM

## 2016-01-20 DIAGNOSIS — F319 Bipolar disorder, unspecified: Secondary | ICD-10-CM | POA: Insufficient documentation

## 2016-01-20 DIAGNOSIS — Y929 Unspecified place or not applicable: Secondary | ICD-10-CM | POA: Diagnosis not present

## 2016-01-20 DIAGNOSIS — F209 Schizophrenia, unspecified: Secondary | ICD-10-CM | POA: Insufficient documentation

## 2016-01-20 HISTORY — DX: Schizophrenia, unspecified: F20.9

## 2016-01-20 HISTORY — DX: Bipolar disorder, unspecified: F31.9

## 2016-01-20 MED ORDER — CYCLOBENZAPRINE HCL 10 MG PO TABS: 10.0000 mg | ORAL_TABLET | Freq: Three times a day (TID) | ORAL | Status: AC | PRN

## 2016-01-20 MED ORDER — IBUPROFEN 600 MG PO TABS
600.0000 mg | ORAL_TABLET | Freq: Once | ORAL | Status: AC
  Administered 2016-01-20: 600 mg via ORAL
  Filled 2016-01-20: qty 1

## 2016-01-20 MED ORDER — IBUPROFEN 600 MG PO TABS: 600.0000 mg | ORAL_TABLET | Freq: Four times a day (QID) | ORAL | Status: AC | PRN
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
## 2017-03-16 ENCOUNTER — Emergency Department (HOSPITAL_COMMUNITY)
Admission: EM | Admit: 2017-03-16 | Discharge: 2017-03-16 | Disposition: A | Discharge status: Home / Self Care | Payer: Medicaid Other | Attending: Emergency Medicine | Admitting: Emergency Medicine

## 2017-03-16 ENCOUNTER — Encounter (HOSPITAL_COMMUNITY): Payer: Self-pay

## 2017-03-16 DIAGNOSIS — Y929 Unspecified place or not applicable: Secondary | ICD-10-CM | POA: Diagnosis not present

## 2017-03-16 DIAGNOSIS — Y939 Activity, unspecified: Secondary | ICD-10-CM | POA: Insufficient documentation

## 2017-03-16 DIAGNOSIS — X58XXXA Exposure to other specified factors, initial encounter: Secondary | ICD-10-CM | POA: Insufficient documentation

## 2017-03-16 DIAGNOSIS — Z79899 Other long term (current) drug therapy: Secondary | ICD-10-CM | POA: Insufficient documentation

## 2017-03-16 DIAGNOSIS — T162XXA Foreign body in left ear, initial encounter: Secondary | ICD-10-CM

## 2017-03-16 DIAGNOSIS — Y999 Unspecified external cause status: Secondary | ICD-10-CM | POA: Insufficient documentation

## 2017-03-16 NOTE — ED Provider Notes (Signed)
[facility]-EMERGENCY DEPT Provider Note   CSN: [id number] Arrival date & time: 03/16/17  0209  By signing my name below, I, Karren CobbleNy'kea Lewis, attest that this documentation has been prepared under the direction and in the presence of Azalia Bilisampos, Brentyn Seehafer, MD. Electronically Signed: Karren CobbleNy'kea Lewis, ED Scribe. 03/16/17. 3:19 AM.   History   Chief Complaint Chief Complaint  Patient presents with  . Foreign Body in Ear   The history is provided by the patient. No language interpreter was used.   HPI Comments: Brendan Alvarez is a 26 y.o. male with no pertinent PMHx, who presents to the Emergency Department complaining of persistent, gradually worsening left ear pain that started several months ago, but worsened tonight after he placed garlic inside of his ear. He notes that the ear is irritated, sometimes painful, and itchy. He notes after placing the garlic inside his ear, it exacerbated the irratation. No other treatment tried. No alleviating factors. No PCP. Denies any other acute associated symptoms at this time.   Past Medical History:  Diagnosis Date  . Bipolar 1 disorder (HCC)   . Schizophrenia (HCC)     There are no active problems to display for this patient.   History reviewed. No pertinent surgical history.   Home Medications    Prior to Admission medications   Medication Sig Start Date End Date Taking? Authorizing Provider  cyclobenzaprine (FLEXERIL) 10 MG tablet Take 1 tablet (10 mg total) by mouth 3 (three) times daily as needed for muscle spasms. 01/20/16   Triplett, Rulon Eisenmengerari B, FNP  ibuprofen (ADVIL,MOTRIN) 600 MG tablet Take 1 tablet (600 mg total) by mouth every 6 (six) hours as needed. 01/20/16   Chinita Pesterriplett, Cari B, FNP    Family History History reviewed. No pertinent family history.  Social History Social History  Substance Use Topics  . Smoking status: Never Smoker  . Smokeless tobacco: Never Used  . Alcohol use No     Allergies   Patient has no known allergies.   Review  of Systems Review of Systems  HENT: Positive for ear pain.        Left ear pain.    A complete 10 system review of systems was obtained and all systems are negative except as noted in the HPI and PMH.    Physical Exam Updated Vital Signs BP (!) 149/87 (BP Location: Left Arm)   Pulse 71   Temp 98.3 F (36.8 C) (Oral)   Resp 18   SpO2 99%   Physical Exam  Constitutional: He is oriented to person, place, and time. He appears well-developed and well-nourished.  HENT:  Head: Normocephalic.  Left tm normal. No bleeding or residual FB noted. TM intact  Eyes: EOM are normal.  Neck: Normal range of motion.  Pulmonary/Chest: Effort normal.  Abdominal: He exhibits no distension.  Musculoskeletal: Normal range of motion.  Neurological: He is alert and oriented to person, place, and time.  Psychiatric: He has a normal mood and affect.  Nursing note and vitals reviewed.   ED Treatments / Results  DIAGNOSTIC STUDIES: Oxygen Saturation is 99% on RA, normal by my interpretation.   COORDINATION OF CARE: 2:59 AM-Discussed next steps with pt. Pt verbalized understanding and is agreeable with the plan.   Labs (all labs ordered are listed, but only abnormal results are displayed) Labs Reviewed - No data to display  EKG  EKG Interpretation None       Radiology No results found.  Procedures .Foreign Body Removal Performed by: Patria ManeAMPOS,  Brittony Billick Authorized by: Azalia Bilis  Consent: Verbal consent obtained. Risks and benefits: risks, benefits and alternatives were discussed Consent given by: patient Time out: Immediately prior to procedure a "time out" was called to verify the correct patient, procedure, equipment, support staff and site/side marked as required. Body area: ear Location details: left ear Localization method: visualized Removal mechanism: ear scoop Complexity: simple 1 objects recovered. Objects recovered: garlic Post-procedure assessment: foreign body  removed Patient tolerance: Patient tolerated the procedure well with no immediate complications   (including critical care time)  Medications Ordered in ED Medications - No data to display   Initial Impression / Assessment and Plan / ED Course  I have reviewed the triage vital signs and the nursing notes.  Pertinent labs & imaging results that were available during my care of the patient were reviewed by me and considered in my medical decision making (see chart for details).     FB removed. No complication. No underlying infection or trauma noted to the ear  Final Clinical Impressions(s) / ED Diagnoses   Final diagnoses:  None    New Prescriptions New Prescriptions   No medications on file  I personally performed the services described in this documentation, which was scribed in my presence. The recorded information has been reviewed and is accurate.       Azalia Bilis, MD 03/19/17 1320

## 2017-03-16 NOTE — ED Triage Notes (Signed)
Pt put garlic in his ear for an ear infection

## 2017-04-05 ENCOUNTER — Emergency Department (HOSPITAL_COMMUNITY)
Admission: EM | Admit: 2017-04-05 | Discharge: 2017-04-05 | Disposition: A | Discharge status: Home / Self Care | Payer: Medicaid Other | Attending: Emergency Medicine | Admitting: Emergency Medicine

## 2017-04-05 ENCOUNTER — Emergency Department (HOSPITAL_COMMUNITY): Payer: Medicaid Other

## 2017-04-05 ENCOUNTER — Encounter (HOSPITAL_COMMUNITY): Payer: Self-pay

## 2017-04-05 DIAGNOSIS — S80851A Superficial foreign body, right lower leg, initial encounter: Secondary | ICD-10-CM | POA: Diagnosis present

## 2017-04-05 DIAGNOSIS — Y939 Activity, unspecified: Secondary | ICD-10-CM | POA: Diagnosis not present

## 2017-04-05 DIAGNOSIS — M795 Residual foreign body in soft tissue: Secondary | ICD-10-CM

## 2017-04-05 DIAGNOSIS — X58XXXA Exposure to other specified factors, initial encounter: Secondary | ICD-10-CM | POA: Insufficient documentation

## 2017-04-05 DIAGNOSIS — Y929 Unspecified place or not applicable: Secondary | ICD-10-CM | POA: Diagnosis not present

## 2017-04-05 DIAGNOSIS — Y999 Unspecified external cause status: Secondary | ICD-10-CM | POA: Diagnosis not present

## 2017-04-05 MED ORDER — LIDOCAINE-EPINEPHRINE (PF) 2 %-1:200000 IJ SOLN
10.0000 mL | Freq: Once | INTRAMUSCULAR | Status: DC
  Filled 2017-04-05: qty 20
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
## 2017-04-05 NOTE — Discharge Instructions (Signed)
Return if you have fever, discharge, or redness at the site of the foreign body.
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
## 2020-03-26 ENCOUNTER — Other Ambulatory Visit: Payer: Self-pay

## 2020-03-26 ENCOUNTER — Emergency Department (HOSPITAL_COMMUNITY)
Admission: EM | Admit: 2020-03-26 | Discharge: 2020-03-26 | Disposition: A | Discharge status: Home / Self Care | Payer: Medicaid Other | Attending: Emergency Medicine | Admitting: Emergency Medicine

## 2020-03-26 ENCOUNTER — Emergency Department (HOSPITAL_COMMUNITY): Payer: Medicaid Other

## 2020-03-26 DIAGNOSIS — R0789 Other chest pain: Secondary | ICD-10-CM | POA: Diagnosis present

## 2020-03-26 MED ORDER — METAXALONE 800 MG PO TABS: 800.0000 mg | ORAL_TABLET | Freq: Three times a day (TID) | ORAL | 0 refills | Status: AC

## 2020-03-26 NOTE — ED Notes (Signed)
Pt unable to sign for AVS pad not working in room.

## 2020-03-26 NOTE — ED Triage Notes (Signed)
Pt c/o on left rib side since yesterday. Reports pain is worse with movement.
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
## 2021-05-25 ENCOUNTER — Ambulatory Visit (HOSPITAL_COMMUNITY)
Admission: EM | Admit: 2021-05-25 | Discharge: 2021-05-25 | Disposition: A | Discharge status: Home / Self Care | Payer: Medicaid Other

## 2021-05-25 ENCOUNTER — Other Ambulatory Visit: Payer: Self-pay

## 2021-05-25 ENCOUNTER — Encounter (HOSPITAL_COMMUNITY): Payer: Self-pay

## 2021-05-25 NOTE — ED Triage Notes (Signed)
Pt reports bilateral rans in the hand x 1 year.

## 2021-05-25 NOTE — Discharge Instructions (Addendum)
You do not have any signs of monkeypox  Will need to follow up with derm for any further testing

## 2021-05-25 NOTE — ED Provider Notes (Signed)
[facility]    CSN: [id number] Arrival date & time: 05/25/21  1334      History   Chief Complaint Chief Complaint  Patient presents with   Rash    HPI ADALBERT ALBERTO is a 30 y.o. male.   Pt here to r/o the bumps he has had on hands over 1 year is not monkeypoxs. Pt states he sees a white spot intermit on his hands only. Denies any blisters, no rash, no erythema.    Past Medical History:  Diagnosis Date   Bipolar 1 disorder ([facility])    Schizophrenia ([facility])     There are no problems to display for this patient.   History reviewed. No pertinent surgical history.     Home Medications    Prior to Admission medications   Medication Sig Start Date End Date Taking? Authorizing Provider  cyclobenzaprine (FLEXERIL) 10 MG tablet Take 1 tablet (10 mg total) by mouth 3 (three) times daily as needed for muscle spasms. 01/20/16   Triplett, Rulon Eisenmenger B, FNP  ibuprofen (ADVIL,MOTRIN) 600 MG tablet Take 1 tablet (600 mg total) by mouth every 6 (six) hours as needed. 01/20/16   Triplett, Cari B, FNP  metaxalone (SKELAXIN) 800 MG tablet Take 1 tablet (800 mg total) by mouth 3 (three) times daily. 03/26/20   Lorre Nick, MD    Family History History reviewed. No pertinent family history.  Social History Social History   Tobacco Use   Smoking status: Never   Smokeless tobacco: Never  Vaping Use   Vaping Use: Never used  Substance Use Topics   Alcohol use: No   Drug use: No     Allergies   Patient has no known allergies.   Review of Systems Review of Systems  Constitutional: Negative.   HENT: Negative.    Eyes: Negative.   Respiratory: Negative.    Cardiovascular: Negative.   Gastrointestinal: Negative.   Skin:        On palm of rt hand has a white dot.     Physical Exam Triage Vital Signs ED Triage Vitals  Enc Vitals Group     BP 05/25/21 1420 (!) 149/81     Pulse Rate 05/25/21 1420 61     Resp 05/25/21 1420 18     Temp 05/25/21 1420 98.6 F (37 C)      Temp Source 05/25/21 1420 Oral     SpO2 05/25/21 1420 98 %     Weight --      Height --      Head Circumference --      Peak Flow --      Pain Score 05/25/21 1418 0     Pain Loc --      Pain Edu? --      Excl. in GC? --    No data found.  Updated Vital Signs BP (!) 149/81 (BP Location: Right Arm)   Pulse 61   Temp 98.6 F (37 C) (Oral)   Resp 18   SpO2 98%   Visual Acuity Right Eye Distance:   Left Eye Distance:   Bilateral Distance:    Right Eye Near:   Left Eye Near:    Bilateral Near:     Physical Exam Constitutional:      Appearance: Normal appearance.  HENT:     Nose: Nose normal.     Mouth/Throat:     Mouth: Mucous membranes are moist.  Cardiovascular:     Rate and Rhythm: Normal rate.  Pulmonary:     Effort: Pulmonary effort is normal.  Skin:    General: Skin is warm.     Findings: No lesion or rash.     Comments: Rt palm of hand small pin point white spot. No open wound, no erythema,   Neurological:     Mental Status: He is alert.     UC Treatments / Results  Labs (all labs ordered are listed, but only abnormal results are displayed) Labs Reviewed - No data to display  EKG   Radiology No results found.  Procedures Procedures (including critical care time)  Medications Ordered in UC Medications - No data to display  Initial Impression / Assessment and Plan / UC Course  I have reviewed the triage vital signs and the nursing notes.  Pertinent labs & imaging results that were available during my care of the patient were reviewed by me and considered in my medical decision making (see chart for details).     You do not have any signs of monkeypox  Will need to follow up with derm for any further testing expressed what symptoms are to look out for monkeypoxs Final Clinical Impressions(s) / UC Diagnoses   Final diagnoses:  Calcinosis     Discharge Instructions      You do not have any signs of monkeypox  Will need to follow up  with derm for any further testing      ED Prescriptions   None    PDMP not reviewed this encounter.   Coralyn Mark, NP 05/25/21 1501
